# Patient Record
Sex: Female | Born: 1975 | Hispanic: Yes | Smoking: Never smoker
Health system: Southern US, Community
[De-identification: ages and names within clinical notes are randomized; demographics above are authoritative.]

## PROBLEM LIST (undated history)

## (undated) DIAGNOSIS — I10 Essential (primary) hypertension: Secondary | ICD-10-CM

## (undated) DIAGNOSIS — D649 Anemia, unspecified: Secondary | ICD-10-CM

## (undated) DIAGNOSIS — F419 Anxiety disorder, unspecified: Secondary | ICD-10-CM

## (undated) HISTORY — PX: HERNIA REPAIR: SHX51

---

## 2004-10-19 ENCOUNTER — Inpatient Hospital Stay: Payer: Self-pay | Admitting: Obstetrics and Gynecology

## 2011-10-20 ENCOUNTER — Ambulatory Visit: Payer: Self-pay | Admitting: Obstetrics and Gynecology

## 2012-03-16 ENCOUNTER — Ambulatory Visit: Payer: Self-pay | Admitting: Urology

## 2012-03-28 ENCOUNTER — Ambulatory Visit: Payer: Self-pay | Admitting: Urology

## 2014-05-07 DIAGNOSIS — N939 Abnormal uterine and vaginal bleeding, unspecified: Secondary | ICD-10-CM | POA: Insufficient documentation

## 2014-05-07 DIAGNOSIS — IMO0002 Reserved for concepts with insufficient information to code with codable children: Secondary | ICD-10-CM | POA: Insufficient documentation

## 2014-05-23 ENCOUNTER — Ambulatory Visit: Payer: Self-pay | Admitting: Obstetrics and Gynecology

## 2014-07-30 NOTE — Op Note (Signed)
PATIENT NAME:  Dorothy Maldonado, [name] MR#:  [id number] DATE OF BIRTH:  March 05, 1976  DATE OF PROCEDURE:  03/28/2012  PRINCIPAL DIAGNOSIS: Bladder pain, pressure, urinary frequency.   POSTOPERATIVE DIAGNOSIS: Bladder pain, pressure, urinary frequency.   PROCEDURE:  1. Cystoscopy. 2. Bladder hydrodistention.   SURGEON: Dr. Assunta GamblesBrian Kishaun Erekson  ANESTHESIA: Laryngeal mask airway anesthesia.   INDICATIONS: The patient is a 39 year old Hispanic female with a history of significant urinary frequency, urgency, with bladder pain and pressure. She has not responded to standard medications. There is suspicion for possible underlying interstitial cystitis. She presents for cystoscopy and hydrodistention for further evaluation.   DESCRIPTION OF PROCEDURE: After informed consent was obtained, the patient was taken to the operating room and placed in the dorsal lithotomy position under laryngeal mask airway anesthesia. The patient was then prepped and draped in the usual standard fashion. The 22-French rigid cystoscope was introduced into the urethra under direct vision with no urethral abnormalities noted. Upon entering the bladder, the mucosa was inspected in its entirety with no gross mucosal lesions noted. Bilateral ureteral orifices were well visualized with no lesions noted. The bladder was then filled. On initial instillation, the first fill was to 450 mL. The bladder was drained. Re-examination of the mucosa demonstrated some hypervascularity with no evidence of glomerulations or excoriations. A second fill was undertaken to 850 mL. Once again, the mucosa demonstrated some hypervascularity with no glomerulations or excoriations. A third fill was undertaken also to 850 mL. This also demonstrated no significant mucosal abnormalities to suggest underlying interstitial cystitis. The bladder was then drained. The cystoscope was removed. An 18-French  red rubber catheter was then inserted into the urinary bladder; 40 mL of 2%  Lidocaine was instilled into the urinary bladder through the red rubber catheter. The catheter was then removed. The patient was returned to the supine position and awakened from laryngeal mask airway anesthesia.  She was taken to the recovery room in stable condition. There were no problems or complications. The patient tolerated the procedure well.   ____________________________ Madolyn FriezeBrian S. Achilles Dunkope, MD bsc:cb D: 03/28/2012 09:31:45 ET T: 03/28/2012 20:37:10 ET JOB#: [id number]  cc: Madolyn FriezeBrian S. Achilles Dunkope, MD, <Dictator> Madolyn FriezeBRIAN S Jaun Galluzzo MD ELECTRONICALLY SIGNED 03/29/2012 22:45

## 2014-08-06 ENCOUNTER — Other Ambulatory Visit: Payer: Self-pay

## 2014-08-12 ENCOUNTER — Inpatient Hospital Stay: Admission: RE | Admit: 2014-08-12 | Payer: Self-pay | Source: Ambulatory Visit | Admitting: Obstetrics and Gynecology

## 2014-08-12 ENCOUNTER — Encounter: Admission: RE | Payer: Self-pay | Source: Ambulatory Visit

## 2014-08-12 SURGERY — HYSTERECTOMY, ABDOMINAL
Anesthesia: Choice

## 2015-05-01 ENCOUNTER — Other Ambulatory Visit: Payer: Self-pay | Admitting: Student

## 2015-05-01 DIAGNOSIS — M5412 Radiculopathy, cervical region: Secondary | ICD-10-CM

## 2015-05-13 ENCOUNTER — Ambulatory Visit: Payer: Self-pay

## 2015-05-28 ENCOUNTER — Ambulatory Visit: Admission: RE | Admit: 2015-05-28 | Payer: BLUE CROSS/BLUE SHIELD | Source: Ambulatory Visit

## 2015-07-04 ENCOUNTER — Other Ambulatory Visit: Payer: Self-pay | Admitting: Internal Medicine

## 2015-07-04 DIAGNOSIS — Z1231 Encounter for screening mammogram for malignant neoplasm of breast: Secondary | ICD-10-CM

## 2015-07-22 ENCOUNTER — Ambulatory Visit
Admission: RE | Admit: 2015-07-22 | Discharge: 2015-07-22 | Disposition: A | Discharge status: Home / Self Care | Payer: BLUE CROSS/BLUE SHIELD | Source: Ambulatory Visit | Attending: Internal Medicine | Admitting: Internal Medicine

## 2015-07-22 DIAGNOSIS — Z1231 Encounter for screening mammogram for malignant neoplasm of breast: Secondary | ICD-10-CM | POA: Insufficient documentation

## 2016-03-01 NOTE — H&P (Signed)
Patient ID: Dorothy BonnetKaren E Tegeler is a 40 y.o. female presenting with Follow-up (pelvic pain, abnormal bleeding)  on 01/26/2016  HPI: Presenting today for discussion of treatment of AUB and pelvic pain. She did have an ultrasound:  Ut wnl  Rt ov wnl Lt complex ov cyst=1.39 cm  Her pain is improved. She feels that something is moving inside  She is requesting definitive management of 16 yrs of heavy menstrual bleeding, now daily. Her ultrasound was normal with a thin endometrial stripe 8.665mm. Her last pap smear was negative 05/07/14.  She has failed other forms of medical management. She is s/p BTL for contraception.   Hx of 3 cesarean sections, 1 with low vertical skin incision, 2 with low transverse.  Endorses deep dysparunia.  EMBx: ENDOMETRIUM, BIOPSY:  NO HYPERPLASIA OR CARCINOMA. PROLIFERATIVE ENDOMETRIUM WITH BREAKDOWN  CHANGES.   Past Medical History:  has a past medical history of Vitamin D deficiency, unspecified.  Past Surgical History:  has a past surgical history that includes Cesarean section; Hernia repair (Bilateral); and Tubal ligation. Family History: family history includes Hypertension in her mother; No Known Problems in her father. Social History:  reports that she has never smoked. She has never used smokeless tobacco. She reports that she does not drink alcohol or use illicit drugs. OB/GYN History:  OB History    Gravida Para Term Preterm AB Living   3 3 3   3    SAB TAB Ectopic Multiple Live Births             Allergies: has No Known Allergies. Medications:  Current Outpatient Prescriptions:  .  buPROPion (WELLBUTRIN XL) 300 MG XL tablet, Take 1 tablet (300 mg total) by mouth once daily., Disp: 30 tablet, Rfl: 5 .  calcium carbonate-vitamin D3 (OS-CAL 500+D) 500 mg(1,250mg ) -200 unit tablet, Take 1 tablet by mouth 2 (two) times daily with meals., Disp: , Rfl:  .  cyclobenzaprine (FLEXERIL) 10 MG tablet, Take 1 tablet (10 mg total)  by mouth 3 (three) times daily as needed., Disp: 90 tablet, Rfl: 3 .  ergocalciferol, vitamin D2, 50,000 unit capsule, Take 1 capsule (50,000 Units total) by mouth once a week., Disp: 4 capsule, Rfl: 5 .  ibuprofen (ADVIL,MOTRIN) 200 MG tablet, Take 1,200 mg by mouth as needed for Pain. Reported on 07/03/2015 , Disp: , Rfl:    Review of Systems: No SOB, no palpitations or chest pain, no new lower extremity edema, no nausea or vomiting or bowel or bladder complaints. See HPI for gyn specific ROS.   Exam:     BP (!) 128/95  Pulse 83  Ht 147.3 cm (4\' 10" )  Wt 64.3 kg (141 lb 12.8 oz)  LMP 01/19/2016 (Exact Date)  BMI 29.64 kg/m2  General: Patient is well-groomed, well-nourished, appears stated age in no acute distress  Abdomen: soft , no mass, non-tender, no rebound tenderness, no hepatomegaly  Pelvic: tanner stage 5 ,              External genitalia: vulva /labia no lesions             Urethra: no prolapse             Vagina: normal physiologic d/c, laxity in vaginal walls             Cervix: no lesions, no cervical motion tenderness, good descent             Uterus: normal size shape and contour, non-tender  Adnexa: no mass,  non-tender               Rectovaginal: External wnl   Impression:   The primary encounter diagnosis was Abnormal uterine bleeding (AUB), unspecified. Diagnoses of Pelvic pain in female and Dyspareunia, female were also pertinent to this visit.    Plan:   Patient returns for followup of pelvic pain, and persistent abnormal uterine bleeding.   We discussed various options to treat her today, and she confirms she desires hysterectomy. Because of her prior abdominal surgeries, Dorothy plan for TAH/ BS and cysto with stay 3-4 days inhouse.   This is an open procedure. The patient and I discussed the technical aspects of the procedure including the potential for risks and complications.  These include but are not limited to the risk of  infection requiring post-operative antibiotics or further procedures.  We talked about the risk of injury to adjacent organs including bladder, bowel, ureter, blood vessels or nerves.  We talked about the need to convert to an open incision.  We talked about the possible need for blood transfusion.  We talked about postop complications such as thromboembolic or cardiopulmonary complications.  She Dorothy likely remain in the hospital for several days after her procedure. All of her questions were answered.  Her preoperative exam was completed and the appropriate consents were signed. She is scheduled to undergo this procedure in the near future.   Specific Peri-operative Considerations:  - Consent: obtained today - Health Maintenance: up to date - Labs: CBC, CMP preoperatively - Studies: EKG, CXR preoperatively - Bowel Preparation: None required - Abx:  Cefoxitin 2 g - VTE ppx: SCDs perioperatively - Glucose Protocol: n/a - Beta-blockade: n/a

## 2016-03-02 ENCOUNTER — Encounter
Admission: RE | Admit: 2016-03-02 | Discharge: 2016-03-02 | Disposition: A | Discharge status: Home / Self Care | Payer: BLUE CROSS/BLUE SHIELD | Source: Ambulatory Visit | Attending: Obstetrics and Gynecology | Admitting: Obstetrics and Gynecology

## 2016-03-02 DIAGNOSIS — Z01812 Encounter for preprocedural laboratory examination: Secondary | ICD-10-CM | POA: Insufficient documentation

## 2016-03-02 HISTORY — DX: Anemia, unspecified: D64.9

## 2016-03-02 HISTORY — DX: Anxiety disorder, unspecified: F41.9

## 2016-03-02 LAB — CBC
HCT: 44 % (ref 35.0–47.0)
Hemoglobin: 15.1 g/dL (ref 12.0–16.0)
MCH: 31 pg (ref 26.0–34.0)
MCHC: 34.3 g/dL (ref 32.0–36.0)
MCV: 90.3 fL (ref 80.0–100.0)
PLATELETS: 245 10*3/uL (ref 150–440)
RBC: 4.88 MIL/uL (ref 3.80–5.20)
RDW: 14.1 % (ref 11.5–14.5)
WBC: 6.7 10*3/uL (ref 3.6–11.0)

## 2016-03-02 LAB — BASIC METABOLIC PANEL
ANION GAP: 6 (ref 5–15)
BUN: 7 mg/dL (ref 6–20)
CHLORIDE: 104 mmol/L (ref 101–111)
CO2: 26 mmol/L (ref 22–32)
Calcium: 9.3 mg/dL (ref 8.9–10.3)
Creatinine, Ser: 0.82 mg/dL (ref 0.44–1.00)
GFR calc non Af Amer: >60 mL/min (ref >60)
Glucose, Bld: 86 mg/dL (ref 65–99)
POTASSIUM: 3.5 mmol/L (ref 3.5–5.1)
SODIUM: 136 mmol/L (ref 135–145)

## 2016-03-02 LAB — TYPE AND SCREEN
ABO/RH(D): O POS
ANTIBODY SCREEN: NEGATIVE

## 2016-03-02 NOTE — Patient Instructions (Signed)
[facility] Su procedimiento est programado para: Report to Day Surgery. 2ND FLOOR MEDICAL MALL ENTRANCE Presntese a: To find out your arrival time please call [phone] between 1PM - 3PM on Friday 03/12/16. Para saber su hora de llegada por favor llame al [phone] entre la 1PM - 3PM el da:  Remember: Instructions that are not followed completely may result in serious medical risk, up to and including death, or upon the discretion of your surgeon and anesthesiologist your surgery may need to be rescheduled.  Recuerde: Las instrucciones que no se siguen completamente Armed forces logistics/support/administrative officerpueden resultar en un riesgo de salud grave, incluyendo hasta la [place] o a discrecin de su cirujano y Scientific laboratory techniciananestesilogo, su ciruga se puede posponer.   __X__ 1. Do not eat food or drink liquids after midnight. No gum chewing or hard candies.  No coma alimentos ni tome lquidos despus de la medianoche.  No mastique chicle ni caramelos  duros.     __X__ 2. No alcohol for 24 hours before or after surgery.    No tome alcohol durante las 24 horas antes ni despus de la [place].   ____ 3. Bring all medications with you on the day of surgery if instructed.    Lleve todos los medicamentos con usted el da de su ciruga si se le ha indicado as.   __X__ 4. Notify your doctor if there is any change in your medical condition (cold, fever,                             infections).    Informe a su mdico si hay algn cambio en su condicin mdica (resfriado, fiebre, infecciones).   Do not wear jewelry, make-up, hairpins, clips or nail polish.  No use joyas, maquillajes, pinzas/ganchos para el cabello ni esmalte de uas.  Do not wear lotions, powders, or perfumes. .  No use lociones, polvos o perfumes.  .    Do not shave 48 hours prior to surgery. Men may shave face and neck.  No se afeite 48 horas antes de la [place].  Los hombres pueden Commercial Metals Companyafeitarse la cara y el cuello.   Do not bring valuables  to the hospital.   No lleve objetos de valor al hospital.  [facility] is not responsible for any belongings or valuables.  [place] no se hace responsable de ningn tipo de pertenencias u objetos de Licensed conveyancervalor.               Contacts, dentures or bridgework may not be worn into surgery.  Los lentes de Eurekacontacto, las dentaduras postizas o puentes no se pueden usar en la [place].  Leave your suitcase in the car. After surgery it may be brought to your room.  Deje su maleta en el auto.  Despus de la ciruga podr traerla a su habitacin.  For patients admitted to the hospital, discharge time is determined by your treatment team.  Para los pacientes que sean ingresados al hospital, el tiempo en el cual se le dar de alta es determinado por su                equipo de [place]tratamiento.   Patients discharged the day of surgery will not be allowed to drive home. A los pacientes que se les da de alta el mismo da de la ciruga no se les permitir conducir a Higher education careers advisercasa.   Please read over the following fact sheets that  you were given: Por favor lea las siguientes hojas de informacin que le dieron:   Pain Booklet   __X__ Take these medicines the morning of surgery with A SIP OF WATER:          Owens-Illinoisome estas medicinas la maana de la ciruga con UN SORBO DE AGUA:  1. BUPROPION  2.   3.   4.       5.  6.  ____ Fleet Enema (as directed)          Enema de Fleet (segn lo indicado)    __X__ Use CHG Soap as directed          Utilice el jabn de CHG segn lo indicado  ____ Use inhalers on the day of surgery          Use los inhaladores el da de la ciruga  ____ Stop metformin 2 days prior to surgery          Deje de tomar el metformin 2 das antes de la ciruga    ____ Take 1/2 of usual insulin dose the night before surgery and none on the morning of surgery           Tome la mitad de la dosis habitual de insulina la noche antes de la [place]uga y no tome nada en la maana de la             ciruga  ____  Stop Coumadin/Plavix/aspirin on           Deje de tomar el Coumadin/Plavix/aspirina el da:  __X__ Stop Anti-inflammatories on STOP ALEVE UNTIL AFTER SURGERY          Deje de tomar antiinflamatorios el da:   ____ Stop supplements until after surgery            Deje de tomar suplementos hasta despus de la ciruga  ____ Bring C-Pap to the hospital          Lleve el C-Pap al hospital

## 2016-03-14 MED ORDER — CEFAZOLIN SODIUM-DEXTROSE 2-4 GM/100ML-% IV SOLN
2.0000 g | INTRAVENOUS | Status: AC
  Administered 2016-03-15: 2 g via INTRAVENOUS

## 2016-03-15 ENCOUNTER — Inpatient Hospital Stay: Payer: BLUE CROSS/BLUE SHIELD | Admitting: Anesthesiology

## 2016-03-15 ENCOUNTER — Encounter
Admission: RE | Disposition: A | Discharge status: Home / Self Care | Payer: Self-pay | Source: Ambulatory Visit | Attending: Obstetrics and Gynecology

## 2016-03-15 ENCOUNTER — Inpatient Hospital Stay
Admission: RE | Admit: 2016-03-15 | Discharge: 2016-03-18 | DRG: 742 | Disposition: A | Discharge status: Home / Self Care | Payer: BLUE CROSS/BLUE SHIELD | Source: Ambulatory Visit | Attending: Obstetrics and Gynecology | Admitting: Obstetrics and Gynecology

## 2016-03-15 ENCOUNTER — Inpatient Hospital Stay: Payer: BLUE CROSS/BLUE SHIELD

## 2016-03-15 DIAGNOSIS — K9171 Accidental puncture and laceration of a digestive system organ or structure during a digestive system procedure: Secondary | ICD-10-CM | POA: Diagnosis not present

## 2016-03-15 DIAGNOSIS — Z9851 Tubal ligation status: Secondary | ICD-10-CM | POA: Diagnosis not present

## 2016-03-15 DIAGNOSIS — R102 Pelvic and perineal pain: Secondary | ICD-10-CM | POA: Diagnosis present

## 2016-03-15 DIAGNOSIS — N941 Unspecified dyspareunia: Secondary | ICD-10-CM | POA: Diagnosis present

## 2016-03-15 DIAGNOSIS — N939 Abnormal uterine and vaginal bleeding, unspecified: Secondary | ICD-10-CM | POA: Diagnosis present

## 2016-03-15 DIAGNOSIS — G8929 Other chronic pain: Secondary | ICD-10-CM | POA: Diagnosis present

## 2016-03-15 DIAGNOSIS — N83201 Unspecified ovarian cyst, right side: Secondary | ICD-10-CM | POA: Diagnosis present

## 2016-03-15 DIAGNOSIS — N9972 Accidental puncture and laceration of a genitourinary system organ or structure during other procedure: Secondary | ICD-10-CM

## 2016-03-15 DIAGNOSIS — S3720XA Unspecified injury of bladder, initial encounter: Secondary | ICD-10-CM

## 2016-03-15 HISTORY — PX: CYSTOSCOPY: SHX5120

## 2016-03-15 HISTORY — PX: HYSTERECTOMY ABDOMINAL WITH SALPINGECTOMY: SHX6725

## 2016-03-15 HISTORY — PX: LAPAROSCOPY: SHX197

## 2016-03-15 HISTORY — PX: UTERINE STENT PLACEMENT: SHX6655

## 2016-03-15 LAB — APTT: APTT: 33 s (ref 24–36)

## 2016-03-15 LAB — PROTIME-INR
INR: 1.17
PROTHROMBIN TIME: 15 s (ref 11.4–15.2)

## 2016-03-15 LAB — POCT PREGNANCY, URINE: Preg Test, Ur: NEGATIVE

## 2016-03-15 LAB — FIBRINOGEN: FIBRINOGEN: 186 mg/dL — AB (ref 210–475)

## 2016-03-15 LAB — ABO/RH: ABO/RH(D): O POS

## 2016-03-15 SURGERY — EXAM UNDER ANESTHESIA
Anesthesia: General | Laterality: Bilateral | Wound class: Clean Contaminated

## 2016-03-15 MED ORDER — ACETAMINOPHEN 500 MG PO TABS
1000.0000 mg | ORAL_TABLET | Freq: Four times a day (QID) | ORAL | Status: DC
  Administered 2016-03-16 – 2016-03-18 (×4): 1000 mg via ORAL
  Filled 2016-03-15 (×6): qty 2

## 2016-03-15 MED ORDER — DOCUSATE SODIUM 100 MG PO CAPS
100.0000 mg | ORAL_CAPSULE | Freq: Two times a day (BID) | ORAL | Status: DC
  Administered 2016-03-15 – 2016-03-18 (×6): 100 mg via ORAL
  Filled 2016-03-15 (×6): qty 1

## 2016-03-15 MED ORDER — PROMETHAZINE HCL 25 MG/ML IJ SOLN: 6.2500 mg | INTRAMUSCULAR | Status: DC | PRN

## 2016-03-15 MED ORDER — FENTANYL CITRATE (PF) 100 MCG/2ML IJ SOLN
INTRAMUSCULAR | Status: AC
  Filled 2016-03-15: qty 2

## 2016-03-15 MED ORDER — ROCURONIUM BROMIDE 100 MG/10ML IV SOLN
INTRAVENOUS | Status: DC | PRN
  Administered 2016-03-15: 10 mg via INTRAVENOUS
  Administered 2016-03-15: 50 mg via INTRAVENOUS
  Administered 2016-03-15: 20 mg via INTRAVENOUS
  Administered 2016-03-15: 10 mg via INTRAVENOUS

## 2016-03-15 MED ORDER — LIDOCAINE HCL (CARDIAC) 20 MG/ML IV SOLN
INTRAVENOUS | Status: DC | PRN
  Administered 2016-03-15: 40 mg via INTRAVENOUS

## 2016-03-15 MED ORDER — NALOXONE HCL 0.4 MG/ML IJ SOLN: 0.4000 mg | INTRAMUSCULAR | Status: DC | PRN

## 2016-03-15 MED ORDER — SODIUM CHLORIDE 0.9 % IJ SOLN
INTRAMUSCULAR | Status: AC
  Filled 2016-03-15: qty 50

## 2016-03-15 MED ORDER — ACETAMINOPHEN 10 MG/ML IV SOLN
INTRAVENOUS | Status: DC | PRN
  Administered 2016-03-15: 1000 mg via INTRAVENOUS

## 2016-03-15 MED ORDER — DIPHENHYDRAMINE HCL 12.5 MG/5ML PO ELIX
12.5000 mg | ORAL_SOLUTION | Freq: Four times a day (QID) | ORAL | Status: DC | PRN
  Filled 2016-03-15: qty 5

## 2016-03-15 MED ORDER — BUPIVACAINE HCL (PF) 0.5 % IJ SOLN
INTRAMUSCULAR | Status: DC | PRN
  Administered 2016-03-15: 10 mL

## 2016-03-15 MED ORDER — MENTHOL 3 MG MT LOZG
1.0000 | LOZENGE | OROMUCOSAL | Status: DC | PRN
  Filled 2016-03-15: qty 9

## 2016-03-15 MED ORDER — BUPROPION HCL ER (XL) 300 MG PO TB24
300.0000 mg | ORAL_TABLET | Freq: Every day | ORAL | Status: DC
  Administered 2016-03-16 – 2016-03-18 (×3): 300 mg via ORAL
  Filled 2016-03-15 (×3): qty 1

## 2016-03-15 MED ORDER — BUPIVACAINE LIPOSOME 1.3 % IJ SUSP
INTRAMUSCULAR | Status: AC
  Filled 2016-03-15: qty 20

## 2016-03-15 MED ORDER — CEFAZOLIN SODIUM-DEXTROSE 2-4 GM/100ML-% IV SOLN
INTRAVENOUS | Status: AC
  Administered 2016-03-15: 2 g via INTRAVENOUS
  Filled 2016-03-15: qty 100

## 2016-03-15 MED ORDER — HYDROMORPHONE 1 MG/ML IV SOLN: INTRAVENOUS | Status: DC

## 2016-03-15 MED ORDER — MIDAZOLAM HCL 2 MG/2ML IJ SOLN
INTRAMUSCULAR | Status: DC | PRN
  Administered 2016-03-15: 2 mg via INTRAVENOUS

## 2016-03-15 MED ORDER — OXYCODONE HCL 5 MG/5ML PO SOLN: 5.0000 mg | Freq: Once | ORAL | Status: DC | PRN

## 2016-03-15 MED ORDER — CEFAZOLIN IN D5W 1 GM/50ML IV SOLN
INTRAVENOUS | Status: DC | PRN
  Administered 2016-03-15: 2 g via INTRAVENOUS

## 2016-03-15 MED ORDER — OXYCODONE HCL 5 MG PO TABS: 5.0000 mg | ORAL_TABLET | Freq: Once | ORAL | Status: DC | PRN

## 2016-03-15 MED ORDER — LACTATED RINGERS IV SOLN
INTRAVENOUS | Status: DC
  Administered 2016-03-15: 09:00:00 via INTRAVENOUS

## 2016-03-15 MED ORDER — BELLADONNA ALKALOIDS-OPIUM 16.2-60 MG RE SUPP: 1.0000 | Freq: Four times a day (QID) | RECTAL | Status: DC | PRN

## 2016-03-15 MED ORDER — ONDANSETRON HCL 4 MG/2ML IJ SOLN
INTRAMUSCULAR | Status: DC | PRN
  Administered 2016-03-15: 4 mg via INTRAVENOUS

## 2016-03-15 MED ORDER — NITROFURANTOIN MONOHYD MACRO 100 MG PO CAPS: 100.0000 mg | ORAL_CAPSULE | Freq: Two times a day (BID) | ORAL | Status: DC

## 2016-03-15 MED ORDER — BUPIVACAINE LIPOSOME 1.3 % IJ SUSP
INTRAMUSCULAR | Status: DC | PRN
  Administered 2016-03-15: 100 mL

## 2016-03-15 MED ORDER — PROPOFOL 10 MG/ML IV BOLUS
INTRAVENOUS | Status: DC | PRN
  Administered 2016-03-15: 150 mg via INTRAVENOUS

## 2016-03-15 MED ORDER — PHENAZOPYRIDINE HCL 100 MG PO TABS
100.0000 mg | ORAL_TABLET | Freq: Three times a day (TID) | ORAL | Status: DC
  Administered 2016-03-15 – 2016-03-18 (×9): 100 mg via ORAL
  Filled 2016-03-15 (×9): qty 1

## 2016-03-15 MED ORDER — SODIUM CHLORIDE 0.9% FLUSH: 9.0000 mL | INTRAVENOUS | Status: DC | PRN

## 2016-03-15 MED ORDER — LACTATED RINGERS IV SOLN
INTRAVENOUS | Status: DC
  Administered 2016-03-15 – 2016-03-17 (×6): via INTRAVENOUS

## 2016-03-15 MED ORDER — MEPERIDINE HCL 25 MG/ML IJ SOLN: 6.2500 mg | INTRAMUSCULAR | Status: DC | PRN

## 2016-03-15 MED ORDER — CEFAZOLIN SODIUM-DEXTROSE 2-4 GM/100ML-% IV SOLN
2.0000 g | Freq: Four times a day (QID) | INTRAVENOUS | Status: AC
  Administered 2016-03-15 – 2016-03-16 (×4): 2 g via INTRAVENOUS
  Filled 2016-03-15 (×4): qty 100

## 2016-03-15 MED ORDER — ERGOCALCIFEROL 1.25 MG (50000 UT) PO CAPS
50000.0000 [IU] | ORAL_CAPSULE | ORAL | Status: DC
  Administered 2016-03-16: 50000 [IU] via ORAL
  Filled 2016-03-15: qty 1

## 2016-03-15 MED ORDER — BUPIVACAINE HCL (PF) 0.5 % IJ SOLN
INTRAMUSCULAR | Status: AC
  Filled 2016-03-15: qty 30

## 2016-03-15 MED ORDER — SUGAMMADEX SODIUM 200 MG/2ML IV SOLN
INTRAVENOUS | Status: DC | PRN
  Administered 2016-03-15: 126.2 mg via INTRAVENOUS

## 2016-03-15 MED ORDER — ACETAMINOPHEN 10 MG/ML IV SOLN
INTRAVENOUS | Status: AC
  Filled 2016-03-15: qty 100

## 2016-03-15 MED ORDER — ONDANSETRON HCL 4 MG/2ML IJ SOLN: 4.0000 mg | Freq: Four times a day (QID) | INTRAMUSCULAR | Status: DC | PRN

## 2016-03-15 MED ORDER — FENTANYL CITRATE (PF) 100 MCG/2ML IJ SOLN
25.0000 ug | INTRAMUSCULAR | Status: DC | PRN
  Administered 2016-03-15 (×2): 50 ug via INTRAVENOUS

## 2016-03-15 MED ORDER — DIPHENHYDRAMINE HCL 50 MG/ML IJ SOLN
12.5000 mg | Freq: Four times a day (QID) | INTRAMUSCULAR | Status: DC | PRN
  Administered 2016-03-17: 12.5 mg via INTRAVENOUS
  Filled 2016-03-15: qty 1

## 2016-03-15 MED ORDER — PHENYLEPHRINE HCL 10 MG/ML IJ SOLN
INTRAMUSCULAR | Status: DC | PRN
  Administered 2016-03-15 (×5): 100 ug via INTRAVENOUS

## 2016-03-15 MED ORDER — FERROUS SULFATE 325 (65 FE) MG PO TABS
325.0000 mg | ORAL_TABLET | Freq: Every day | ORAL | Status: DC
  Administered 2016-03-16 – 2016-03-18 (×3): 325 mg via ORAL
  Filled 2016-03-15 (×3): qty 1

## 2016-03-15 MED ORDER — CELECOXIB 200 MG PO CAPS
200.0000 mg | ORAL_CAPSULE | Freq: Two times a day (BID) | ORAL | Status: DC
  Administered 2016-03-16 – 2016-03-18 (×5): 200 mg via ORAL
  Filled 2016-03-15 (×5): qty 1

## 2016-03-15 MED ORDER — CEFAZOLIN SODIUM-DEXTROSE 2-3 GM-% IV SOLR
INTRAVENOUS | Status: DC | PRN
  Administered 2016-03-15: 2 g via INTRAVENOUS

## 2016-03-15 MED ORDER — OXYCODONE HCL 5 MG PO TABS
5.0000 mg | ORAL_TABLET | ORAL | Status: DC | PRN
  Administered 2016-03-16 – 2016-03-17 (×6): 5 mg via ORAL
  Filled 2016-03-15 (×7): qty 1

## 2016-03-15 MED ORDER — FENTANYL CITRATE (PF) 100 MCG/2ML IJ SOLN
INTRAMUSCULAR | Status: DC | PRN
  Administered 2016-03-15 (×3): 50 ug via INTRAVENOUS
  Administered 2016-03-15: 100 ug via INTRAVENOUS

## 2016-03-15 MED ORDER — METHYLENE BLUE 0.5 % INJ SOLN
INTRAVENOUS | Status: AC
Start: 2016-03-15 — End: 2016-03-15
  Filled 2016-03-15: qty 10

## 2016-03-15 MED ORDER — LACTATED RINGERS IV SOLN: INTRAVENOUS | Status: DC

## 2016-03-15 MED ORDER — EPHEDRINE SULFATE 50 MG/ML IJ SOLN
INTRAMUSCULAR | Status: DC | PRN
  Administered 2016-03-15 (×3): 5 mg via INTRAVENOUS

## 2016-03-15 MED ORDER — DEXAMETHASONE SODIUM PHOSPHATE 10 MG/ML IJ SOLN
INTRAMUSCULAR | Status: DC | PRN
  Administered 2016-03-15: 8 mg via INTRAVENOUS

## 2016-03-15 MED ORDER — METHYLENE BLUE 0.5 % INJ SOLN
INTRAVENOUS | Status: DC | PRN
  Administered 2016-03-15: 250 mL via INTRAVESICAL

## 2016-03-15 MED ORDER — GABAPENTIN 300 MG PO CAPS
900.0000 mg | ORAL_CAPSULE | Freq: Every day | ORAL | Status: DC
  Administered 2016-03-16 – 2016-03-17 (×2): 900 mg via ORAL
  Filled 2016-03-15 (×2): qty 3

## 2016-03-15 MED ORDER — HYDROMORPHONE HCL 1 MG/ML IJ SOLN
1.0000 mg | INTRAMUSCULAR | Status: DC | PRN
  Administered 2016-03-15: 1 mg via INTRAVENOUS
  Administered 2016-03-15 – 2016-03-16 (×4): 2 mg via INTRAVENOUS
  Filled 2016-03-15 (×3): qty 2
  Filled 2016-03-15: qty 1
  Filled 2016-03-15 (×2): qty 2

## 2016-03-15 SURGICAL SUPPLY — 73 items
BAG URO DRAIN 2000ML W/SPOUT (MISCELLANEOUS) ×4 IMPLANT
CANISTER SUCT 1200ML W/VALVE (MISCELLANEOUS) ×4 IMPLANT
CATH FOL 2WAY LX 16X5 (CATHETERS) ×4 IMPLANT
CATH FOL 3WAY LX 18X30 (CATHETERS) ×8 IMPLANT
CATH ROBINSON RED A/P 16FR (CATHETERS) ×4 IMPLANT
CATH TRAY 16F METER LATEX (MISCELLANEOUS) ×4 IMPLANT
CATH URETL OPEN END 4X70 (CATHETERS) ×4 IMPLANT
CHLORAPREP W/TINT 26ML (MISCELLANEOUS) ×4 IMPLANT
CORD BIP STRL DISP 12FT (MISCELLANEOUS) ×4 IMPLANT
COVER LIGHT HANDLE STERIS (MISCELLANEOUS) ×4 IMPLANT
DEFOGGER SCOPE WARMER CLEARIFY (MISCELLANEOUS) ×4 IMPLANT
DERMABOND ADVANCED (GAUZE/BANDAGES/DRESSINGS) ×2
DERMABOND ADVANCED .7 DNX12 (GAUZE/BANDAGES/DRESSINGS) ×2 IMPLANT
DRAPE LAP W/FLUID (DRAPES) ×4 IMPLANT
DRAPE UNDER BUTTOCK W/FLU (DRAPES) ×4 IMPLANT
DRSG TELFA 3X8 NADH (GAUZE/BANDAGES/DRESSINGS) ×8 IMPLANT
ELECT BLADE 6.5 EXT (BLADE) ×4 IMPLANT
ELECT CAUTERY BLADE 6.4 (BLADE) ×4 IMPLANT
ELECT REM PT RETURN 9FT ADLT (ELECTROSURGICAL) ×4
ELECTRODE REM PT RTRN 9FT ADLT (ELECTROSURGICAL) ×2 IMPLANT
GAUZE SPONGE 4X4 12PLY STRL (GAUZE/BANDAGES/DRESSINGS) ×8 IMPLANT
GLOVE BIO SURGEON STRL SZ7 (GLOVE) ×4 IMPLANT
GLOVE BIO SURGEON STRL SZ8 (GLOVE) ×4 IMPLANT
GLOVE INDICATOR 7.5 STRL GRN (GLOVE) ×4 IMPLANT
GOWN STRL REUS W/ TWL LRG LVL3 (GOWN DISPOSABLE) ×4 IMPLANT
GOWN STRL REUS W/ TWL XL LVL3 (GOWN DISPOSABLE) ×2 IMPLANT
GOWN STRL REUS W/TWL LRG LVL3 (GOWN DISPOSABLE) ×4
GOWN STRL REUS W/TWL XL LVL3 (GOWN DISPOSABLE) ×2
HEMOSTAT SURGICEL 2X14 (HEMOSTASIS) ×8 IMPLANT
IRRIGATION STRYKERFLOW (MISCELLANEOUS) ×2 IMPLANT
IRRIGATOR STRYKERFLOW (MISCELLANEOUS) ×4
IV LACTATED RINGERS 1000ML (IV SOLUTION) ×4 IMPLANT
KIT RM TURNOVER CYSTO AR (KITS) ×4 IMPLANT
LABEL OR SOLS (LABEL) ×4 IMPLANT
LIGASURE VESSEL 5MM BLUNT TIP (ELECTROSURGICAL) ×4 IMPLANT
MANIPULATOR VCARE SML CRV RETR (MISCELLANEOUS) ×4 IMPLANT
NDL HPO THNWL 1X22GA REG BVL (NEEDLE) ×2 IMPLANT
NEEDLE HYPO 22GX1.5 SAFETY (NEEDLE) ×4 IMPLANT
NEEDLE SAFETY 22GX1 (NEEDLE) ×2
NS IRRIG 500ML POUR BTL (IV SOLUTION) ×4 IMPLANT
OCCLUDER COLPOPNEUMO (BALLOONS) ×4 IMPLANT
PACK BASIN MAJOR ARMC (MISCELLANEOUS) ×4 IMPLANT
PAD PREP 24X41 OB/GYN DISP (PERSONAL CARE ITEMS) ×4 IMPLANT
PLUG CATH AND CAP STER (CATHETERS) ×4 IMPLANT
RETRACTOR WOUND ALXS 18CM MED (MISCELLANEOUS) ×2 IMPLANT
RTRCTR WOUND ALEXIS O 18CM MED (MISCELLANEOUS) ×4
SCISSORS METZENBAUM CVD 33 (INSTRUMENTS) ×4 IMPLANT
SET CYSTO W/LG BORE CLAMP LF (SET/KITS/TRAYS/PACK) ×4 IMPLANT
SLEEVE ENDOPATH XCEL 5M (ENDOMECHANICALS) ×8 IMPLANT
SOL PREP PVP 2OZ (MISCELLANEOUS) ×4
SOLUTION PREP PVP 2OZ (MISCELLANEOUS) ×2 IMPLANT
SPONGE LAP 18X18 5 PK (GAUZE/BANDAGES/DRESSINGS) ×8 IMPLANT
SPONGE XRAY 4X4 16PLY STRL (MISCELLANEOUS) ×4 IMPLANT
STAPLER SKIN PROX 35W (STAPLE) ×8 IMPLANT
SURGILUBE 2OZ TUBE FLIPTOP (MISCELLANEOUS) ×4 IMPLANT
SUT MNCRL 3-0 (SUTURE) ×4 IMPLANT
SUT MNCRL 3-0 VIOLET CT-1 (SUTURE) ×4 IMPLANT
SUT MON AB 3-0 SH 27 (SUTURE) ×4 IMPLANT
SUT MONOCRYL 3-0 (SUTURE) ×4
SUT PDS AB 1 TP1 96 (SUTURE) ×12 IMPLANT
SUT VIC AB 0 CT1 27 (SUTURE) ×8
SUT VIC AB 0 CT1 27XCR 8 STRN (SUTURE) ×8 IMPLANT
SUT VIC AB 0 CT1 36 (SUTURE) ×8 IMPLANT
SUT VIC AB 2-0 SH 27 (SUTURE) ×8
SUT VIC AB 2-0 SH 27XBRD (SUTURE) ×8 IMPLANT
SUT VICRYL PLUS ABS 0 54 (SUTURE) ×4 IMPLANT
SUTURE MNCRYL 4-0 (SUTURE) ×4 IMPLANT
SYR 10ML LL (SYRINGE) ×8 IMPLANT
SYR 50ML LL SCALE MARK (SYRINGE) ×4 IMPLANT
SYR BULB IRRIG 60ML STRL (SYRINGE) ×4 IMPLANT
SYRINGE IRR TOOMEY STRL 70CC (SYRINGE) ×4 IMPLANT
TRAY PREP VAG/GEN (MISCELLANEOUS) ×4 IMPLANT
WATER STERILE IRR 1000ML POUR (IV SOLUTION) ×4 IMPLANT

## 2016-03-15 NOTE — Interval H&P Note (Signed)
History and Physical Interval Note:  At the time of her arrival for her surgery, she received a physical exam CV: RRR Pulm: CTAB  03/15/2016 3:11 PM  Andrey CotaKaren Schnee  has presented today for surgery, with the diagnosis of AUB  Pelvic pain  The various methods of treatment have been discussed with the patient and family. After consideration of risks, benefits and other options for treatment, the patient has consented to  Procedure(s): EXAM UNDER ANESTHESIA LAPAROSCOPY DIAGNOSTIC HYSTERECTOMY ABDOMINAL WITH BILATERAL SALPINGECTOMY, RIGHT ADNEXAL BIOPSY, RIGHT OVARIAN CYSTECTOMY, RIGHT OVARIAN CYSTECTOMY (Bilateral) CYSTOSCOPY UTERINE STENT PLACEMENT AND REMOVAL as a surgical intervention .  The patient's history has been reviewed, patient examined, no change in status, stable for surgery.  I have reviewed the patient's chart and labs.  Questions were answered to the patient's satisfaction.     Christeen DouglasBEASLEY, [place]

## 2016-03-15 NOTE — Anesthesia Procedure Notes (Signed)
Procedure Name: Intubation Date/Time: 03/15/2016 10:30 AM Performed by: Henrietta HooverPOPE, Shamel Germond Pre-anesthesia Checklist: Patient identified, Emergency Drugs available, Suction available, Patient being monitored and Timeout performed Patient Re-evaluated:Patient Re-evaluated prior to inductionOxygen Delivery Method: Circle system utilized Preoxygenation: Pre-oxygenation with 100% oxygen Intubation Type: IV induction Ventilation: Mask ventilation without difficulty Laryngoscope Size: Mac and 3 Grade View: Grade II Tube type: Oral Tube size: 7.0 mm Number of attempts: 1 Airway Equipment and Method: Stylet Placement Confirmation: ETT inserted through vocal cords under direct vision,  positive ETCO2 and breath sounds checked- equal and bilateral Secured at: 21 cm Dental Injury: Teeth and Oropharynx as per pre-operative assessment

## 2016-03-15 NOTE — Interval H&P Note (Signed)
History and Physical Interval Note:  03/15/2016 9:36 AM  Dorothy CotaKaren Eckstrom  has presented today for surgery, with the diagnosis of AUB  Pelvic pain  The various methods of treatment have been discussed with the patient and family. After consideration of risks, benefits and other options for treatment, the patient has consented to  Procedure(s): HYSTERECTOMY ABDOMINAL WITH SALPINGECTOMY (Bilateral) CYSTOSCOPY (N/A) as a surgical intervention .  I will do a laparoscopic view and if we can proceed with a minimally invasive case we well. However, I think it likely we will open.  The patient's history has been reviewed, patient examined, no change in status, stable for surgery.  I have reviewed the patient's chart and labs.  Questions were answered to the patient's satisfaction.     Christeen DouglasBEASLEY, [place]

## 2016-03-15 NOTE — Op Note (Addendum)
Andrey CotaKaren Macrae PROCEDURE DATE: 03/15/2016  PREOPERATIVE DIAGNOSIS:  Abnormal uterine bleeding, pelvic pain. History of 3 prior cesarean sections 1 with a low vertical skin incision and 2 with low-transverse incisions, deep dyspareunia. POSTOPERATIVE DIAGNOSIS:  Same as above, with the addition of dense adhesions between the bladder and the uterus  SURGEON:   Christeen DouglasBethany Danniella Robben, MD. ASSISTANT: Jennell Cornerhomas Schermerhorn, M.D. ANESTHESIOLOGIST: Alver FisherAmy Penwarden, MD Anesthesiologist: Alver FisherAmy Penwarden, MD CRNA: Malva Coganatherine Beane, CRNA; Michaele OfferKasey Savage, CRNA; Henrietta HooverKimberly Pope, CRNA  OPERATION:  Exam under anesthesia, diagnostic laparoscopy, Total abdominal hysterectomy, Bilateral Salpingectomy, repair of cyststomy, cystoscopy and placement and removal left ureteral stent. Right adnexal biopsy, right ovarian cystectomy. ANESTHESIA:  General endotracheal. Exparel placed at fascial line and skin closure.  INDICATIONS: The patient is a 40 y.o. F with the aforementioned diagnoses who desires definitive surgical management. On the preoperative visit, the risks, benefits, indications, and alternatives of the procedure were reviewed with the patient.  On the day of surgery, the risks of surgery were again discussed with the patient including but not limited to: bleeding which may require transfusion or reoperation; infection which may require antibiotics; injury to bowel, bladder, ureters or other surrounding organs; need for additional procedures; thromboembolic phenomenon, incisional problems and other postoperative/anesthesia complications. Written informed consent was obtained.    OPERATIVE FINDINGS: A normally sized uterus with left pelvic sidewall adhesions. Normal tubes bilaterally. Normal left ovary. Right adnexa with a dark colored implant on the tube, and right ovarian cyst. Adhesions between the bladder and the uterus.  ESTIMATED BLOOD LOSS: 200 ml FLUIDS:  2200 ml of Lactated Ringers URINE OUTPUT:  400 ml of clear yellow  urine. SPECIMENS:  Uterus,cervix and Left fallopian tube, right adnexal biopsy and right fallopian tube sent to pathology COMPLICATIONS:  Cystotomy measuring 1 cm at the posterior left bladder, midway between the trigone and the dome. This was repaired with 4-0 Monocryl and 3-0 Vicryl in a double layer, with no tension on the suture.   DESCRIPTION OF PROCEDURE:  The patient received intravenous antibiotics and had sequential compression devices applied to her lower extremities while in the preoperative area.   She was taken to the operating room and placed under general anesthesia without difficulty.The abdomen and perineum were prepped and draped in a sterile manner, and she was placed in a dorsal supine position.  A Foley catheter was inserted into the bladder and attached to constant drainage. After an adequate timeout was performed, a 5 mm periumbilical incision was made. A 5 mm Optiview point was placed under direct visualization. Survey of the pelvic cavity noted the adhesions above. However the uterus was mobile and it appeared that the case could be accomplished laparoscopically. Therefore, 2 more 5 mm ports were placed in the left and right lower quadrant under direct visualization. Both ureters were noted to be coursing away from the operative field. The left fallopian tube and left round ligament were taken down using LigaSure electrocautery. The bladder plane was developed down the left side and into the adhesion. At this point, a cystotomy was noted, despite care taken to maintain visualization of the bladder edges. The case was stopped, and the decision was made to convert to open laparotomy for repair of the cystotomy.  A Pfannensteil skin incision was made. This incision was taken down to the fascia using electrocautery with care given to maintain good hemostasis. The fascia was incised in the midline and the fascial incision was then extended bilaterally using sharp incision without  difficulty. The fascia was  then dissected off the underlying rectus muscles using blunt and sharp dissection. The rectus muscles were split bluntly in the midline and the peritoneum entered sharply without complication. There were multiple scar tissue site between the rectus muscles and the fascia, with many perforating vessels. Care was taken to maintain hemostasis, although when closing these vessels were bleeding again and needed to be re-sewn, and sealed with further electrocautery.   This peritoneal incision was then extended superiorly and inferiorly with care given to prevent bowel or bladder injury. Attention was then turned to the pelvis. A retractor was placed into the incision, and the bowel was packed away with moist laparotomy sponges. A large ALexus O retractor was placed. The uterus at this point was noted to be mobilized and was delivered up out of the abdomen.  The round ligaments on each side were clamped, suture ligated with 0 Vicryl, and transected with electrocautery allowing entry into the broad ligament. Of note, all sutures used in this procedure are 0 Vicryl unless otherwise noted. The anterior and posterior leaves of the broad ligament were separated, and the ureters were inspected to be safely away from the area of dissection bilaterally.  Adnexae were clamped on the patient's right side, cut, and doubly suture ligated.Kelly clamps were placed on the mesosalpinx of the right fallopian tube, and the fallopian tube was excised.  The pedicle was then secured with a free tie.  A similar process was carried out on the left side, allowing for bilateral salpingectomy.     A bladder flap was then created carefully, noting the prior defect. A tagging 4-0 Monocryl suture was used at this defect site.   The bladder was then bluntly dissected off the lower uterine segment and cervix with good hemostasis noted. The uterine arteries were then skeletonized bilaterally and then clamped, cut, and  doubly suture ligated with care given to prevent ureteral injury.  The uterosacral ligaments were then clamped, cut, and ligated bilaterally.  Finally, the cardinal ligaments were clamped, cut, and ligated bilaterally.  Acutely curved clamps were placed across the vagina just under the cervix, and the specimen was amputated and sent to pathology. The vaginal cuff angles were closed with Heaney stiches with care given to incorporate the uterosacral-cardinal ligament pedicles on both sides. The middle of the vaginal cuff was closed with a series of interrupted figure-of-eight sutures with care given to incorporate the anterior pubocervical fascia and the posterior rectovaginal fascia.   The pelvis was irrigated and hemostasis was reconfirmed at all pedicles and along the pelvic sidewall.  Several electrocautery areas were needed on the right side along the pelvic sidewall.   The ureters were inspected and noted to be peristalsing bilaterally.  All laparotomy sponges and instruments were removed from the abdomen. The peritoneum was closed with a running stitch, but continued bleeding caused this to take this stitch out and continue to Hunt. The perforators on the right side at the rectus muscle, encased in scar tissue, were noted to be bleeding. These were sealed with electrocautery. The fascia was closed in a running fashion. The subcutaneous layer was reapproximated with 2-0 Monocryl. The skin was closed with staples, because of her continued oozing from various sites. Coags including fibrinogen were collected from the patient at this time. A second dose of prophylactic antibiotics were given because the time from prior administration was 4 hours.Marland Kitchen. Sponge, lap, needle, and instrument counts were correct times two. The patient was taken to the recovery area awake, extubated and  in stable condition. She was not given Toradol, but was given IV acetaminophen.

## 2016-03-15 NOTE — Anesthesia Preprocedure Evaluation (Signed)
Anesthesia Evaluation  Patient identified by MRN, date of birth, ID band Patient awake    Reviewed: Allergy & Precautions, NPO status , Patient's Chart, lab work & pertinent test results  History of Anesthesia Complications Negative for: history of anesthetic complications  Airway Mallampati: II  TM Distance: >3 FB Neck ROM: Full    Dental no notable dental hx.    Pulmonary neg pulmonary ROS, neg sleep apnea, neg COPD,    breath sounds clear to auscultation- rhonchi (-) wheezing      Cardiovascular Exercise Tolerance: Good (-) hypertension(-) CAD and (-) Past MI  Rhythm:Regular Rate:Normal - Systolic murmurs and - Diastolic murmurs    Neuro/Psych Anxiety negative neurological ROS     GI/Hepatic negative GI ROS, Neg liver ROS,   Endo/Other  negative endocrine ROSneg diabetes  Renal/GU negative Renal ROS     Musculoskeletal negative musculoskeletal ROS (+)   Abdominal (+) - obese,   Peds  Hematology  (+) anemia ,   Anesthesia Other Findings Past Medical History: No date: Anemia No date: Anxiety   Reproductive/Obstetrics                             Anesthesia Physical Anesthesia Plan  ASA: II  Anesthesia Plan: General   Post-op Pain Management:    Induction: Intravenous  Airway Management Planned: Oral ETT  Additional Equipment:   Intra-op Plan:   Post-operative Plan: Extubation in OR  Informed Consent: I have reviewed the patients History and Physical, chart, labs and discussed the procedure including the risks, benefits and alternatives for the proposed anesthesia with the patient or authorized representative who has indicated his/her understanding and acceptance.   Dental advisory given  Plan Discussed with: Anesthesiologist and CRNA  Anesthesia Plan Comments:         Anesthesia Quick Evaluation

## 2016-03-15 NOTE — Progress Notes (Signed)
Day of Surgery Procedure(s) (LRB): EXAM UNDER ANESTHESIA LAPAROSCOPY DIAGNOSTIC HYSTERECTOMY ABDOMINAL WITH BILATERAL SALPINGECTOMY, RIGHT ADNEXAL BIOPSY, RIGHT OVARIAN CYSTECTOMY, RIGHT OVARIAN CYSTECTOMY (Bilateral) CYSTOSCOPY UTERINE STENT PLACEMENT AND REMOVAL  Subjective: Patient reports bladder pain. No incisional pain.    Objective: I have reviewed patient's vital signs, intake and output, medications and labs.  General: fatigued and mild distress Resp: clear to auscultation bilaterally Cardio: regular rate and rhythm, S1, S2 normal, no murmur, click, rub or gallop GI: soft, non-tender; bowel sounds normal; no masses,  no organomegaly and incision: dressing c/d/i  Assessment: s/p Procedure(s): EXAM UNDER ANESTHESIA LAPAROSCOPY DIAGNOSTIC HYSTERECTOMY ABDOMINAL WITH BILATERAL SALPINGECTOMY, RIGHT ADNEXAL BIOPSY, RIGHT OVARIAN CYSTECTOMY, RIGHT OVARIAN CYSTECTOMY (Bilateral) CYSTOSCOPY UTERINE STENT PLACEMENT AND REMOVAL: stable  Plan: 1. Cystotomy: -  Foley cath in place x7 days. This is the only part that hurts her. Azo ordered.  - I placed a renal ultrasound order for 48hrs from surgery, to evaluate left kidney and ureter to assure ureteral patency. However, the ultrasound was performed today. Order confirmed for 03/17/16. Order replaced. - Plan for cystourogram 7 days after surgery with plan to remove foley in the office at that time. Macrobid day prior, day of and day after this test. - Strict Is/Os  2. Postop care: Routine. ADAT.  3. Pain: PCA if needed, though not currently required. PO meds with iv rescue currently. 4. OOB as tolerated, likely tomorrow. 5. PPX: Incentive spirometer. Continue SCDs until ambulating.    LOS: 0 days    Dorothy Maldonado 03/15/2016, 7:18 PM

## 2016-03-15 NOTE — Transfer of Care (Signed)
Immediate Anesthesia Transfer of Care Note  Patient: Dorothy CotaKaren Kasinger  Procedure(s) Performed: Procedure(s): HYSTERECTOMY ABDOMINAL WITH SALPINGECTOMY (Bilateral) CYSTOSCOPY (N/A)  Patient Location: PACU  Anesthesia Type:General  Level of Consciousness: awake  Airway & Oxygen Therapy: Patient Spontanous Breathing and Patient connected to face mask oxygen  Post-op Assessment: Report given to RN and Post -op Vital signs reviewed and stable  Post vital signs: Reviewed and stable  Last Vitals:  Vitals:   03/15/16 0941 03/15/16 1454  BP:  114/68  Pulse:  90  Resp:  20  Temp: 37.4 C 36.4 C    Last Pain:  Vitals:   03/15/16 0941  TempSrc: Tympanic         Complications: No apparent anesthesia complications

## 2016-03-15 NOTE — OR Nursing (Signed)
Clarified  Obtained consent with Dr Dalbert GarnetBeasley.

## 2016-03-16 ENCOUNTER — Encounter: Payer: Self-pay | Admitting: Obstetrics and Gynecology

## 2016-03-16 LAB — BASIC METABOLIC PANEL
Anion gap: 7 (ref 5–15)
BUN: 7 mg/dL (ref 6–20)
CHLORIDE: 106 mmol/L (ref 101–111)
CO2: 24 mmol/L (ref 22–32)
CREATININE: 0.86 mg/dL (ref 0.44–1.00)
Calcium: 8.3 mg/dL — ABNORMAL LOW (ref 8.9–10.3)
GFR calc non Af Amer: >60 mL/min (ref >60)
Glucose, Bld: 128 mg/dL — ABNORMAL HIGH (ref 65–99)
POTASSIUM: 4.1 mmol/L (ref 3.5–5.1)
SODIUM: 137 mmol/L (ref 135–145)

## 2016-03-16 LAB — CBC
HEMATOCRIT: 35.4 % (ref 35.0–47.0)
HEMOGLOBIN: 12 g/dL (ref 12.0–16.0)
MCH: 30.8 pg (ref 26.0–34.0)
MCHC: 34 g/dL (ref 32.0–36.0)
MCV: 90.6 fL (ref 80.0–100.0)
Platelets: 242 10*3/uL (ref 150–440)
RBC: 3.9 MIL/uL (ref 3.80–5.20)
RDW: 14.1 % (ref 11.5–14.5)
WBC: 11.2 10*3/uL — ABNORMAL HIGH (ref 3.6–11.0)

## 2016-03-16 MED ORDER — SIMETHICONE 80 MG PO CHEW
80.0000 mg | CHEWABLE_TABLET | Freq: Four times a day (QID) | ORAL | Status: DC | PRN
  Administered 2016-03-16 (×2): 80 mg via ORAL
  Filled 2016-03-16 (×2): qty 1

## 2016-03-16 NOTE — Progress Notes (Signed)
1 Day Post-Op Procedure(s) (LRB): EXAM UNDER ANESTHESIA LAPAROSCOPY DIAGNOSTIC HYSTERECTOMY ABDOMINAL WITH BILATERAL SALPINGECTOMY, RIGHT ADNEXAL BIOPSY, RIGHT OVARIAN CYSTECTOMY, RIGHT OVARIAN CYSTECTOMY (Bilateral) CYSTOSCOPY UTERINE STENT PLACEMENT AND REMOVAL  Subjective: Patient reports incisional pain.  Bladder pain  Objective: I have reviewed patient's vital signs, intake and output, medications and labs.  General: alert, cooperative and appears stated age Resp: clear to auscultation bilaterally Cardio: regular rate and rhythm, S1, S2 normal, no murmur, click, rub or gallop GI: soft, non-tender; bowel sounds normal; no masses,  no organomegaly and dressing: clean, dry and intact Extremities: extremities normal, atraumatic, no cyanosis or edema and Homans sign is negative, no sign of DVT. SCDs in place  Assessment: s/p Procedure(s): EXAM UNDER ANESTHESIA LAPAROSCOPY DIAGNOSTIC HYSTERECTOMY ABDOMINAL WITH BILATERAL SALPINGECTOMY, RIGHT ADNEXAL BIOPSY, RIGHT OVARIAN CYSTECTOMY, RIGHT OVARIAN CYSTECTOMY (Bilateral) CYSTOSCOPY UTERINE STENT PLACEMENT AND REMOVAL: stable  Plan: Advance diet Encourage ambulation - she does not feel ready, but we will at least sit on the bed Advance to PO medication with IV rescue Continue foley due to cystotomy  LOS: 1 day    Dorothy Maldonado 03/16/2016, 8:40 AM

## 2016-03-16 NOTE — Anesthesia Postprocedure Evaluation (Signed)
Anesthesia Post Note  Patient: Dorothy CotaKaren File  Procedure(s) Performed: Procedure(s) (LRB): EXAM UNDER ANESTHESIA LAPAROSCOPY DIAGNOSTIC HYSTERECTOMY ABDOMINAL WITH BILATERAL SALPINGECTOMY, RIGHT ADNEXAL BIOPSY, RIGHT OVARIAN CYSTECTOMY, RIGHT OVARIAN CYSTECTOMY (Bilateral) CYSTOSCOPY UTERINE STENT PLACEMENT AND REMOVAL  Patient location during evaluation: PACU Anesthesia Type: General Level of consciousness: awake and alert Pain management: pain level controlled Vital Signs Assessment: post-procedure vital signs reviewed and stable Respiratory status: spontaneous breathing, nonlabored ventilation and respiratory function stable Cardiovascular status: blood pressure returned to baseline and stable Postop Assessment: no signs of nausea or vomiting Anesthetic complications: no    Last Vitals:  Vitals:   03/15/16 2300 03/16/16 0300  BP: 100/60 100/61  Pulse: 80 84  Resp: 16 16  Temp: 37.1 C 36.8 C    Last Pain:  Vitals:   03/16/16 0605  TempSrc:   PainSc: Asleep                 Draya Felker

## 2016-03-17 LAB — SURGICAL PATHOLOGY

## 2016-03-17 NOTE — Progress Notes (Signed)
2 Days Post-Op Procedure(s) (LRB): EXAM UNDER ANESTHESIA LAPAROSCOPY DIAGNOSTIC HYSTERECTOMY ABDOMINAL WITH BILATERAL SALPINGECTOMY, RIGHT ADNEXAL BIOPSY, RIGHT OVARIAN CYSTECTOMY, RIGHT OVARIAN CYSTECTOMY (Bilateral) CYSTOSCOPY UTERINE STENT PLACEMENT AND REMOVAL  Subjective: Patient reports no n/v. Tolerating po. PO meds today, no iv. OOB x1 to chair Renal ultrasound today normal, with no hydro on left or right.  Objective: I have reviewed patient's vital signs, intake and output, medications and labs.  General: alert, cooperative and appears stated age Resp: clear to auscultation bilaterally Cardio: regular rate and rhythm, S1, S2 normal, no murmur, click, rub or gallop GI: soft, non-tender; bowel sounds normal; no masses,  no organomegaly and incision: clean, dry and intact Extremities: extremities normal, atraumatic, no cyanosis or edema  Assessment: s/p Procedure(s): EXAM UNDER ANESTHESIA LAPAROSCOPY DIAGNOSTIC HYSTERECTOMY ABDOMINAL WITH BILATERAL SALPINGECTOMY, RIGHT ADNEXAL BIOPSY, RIGHT OVARIAN CYSTECTOMY, RIGHT OVARIAN CYSTECTOMY (Bilateral) CYSTOSCOPY UTERINE STENT PLACEMENT AND REMOVAL: stable  Plan: Renal scan reassuring. Will repeat labs tomorrow. Advance diet Encourage ambulation  Advance to PO medication with IV rescue Continue foley due to cystotomy LOS: 1 day    LOS: 2 days    Gray Maugeri 03/17/2016, 12:54 PM

## 2016-03-18 LAB — BASIC METABOLIC PANEL
ANION GAP: 4 — AB (ref 5–15)
BUN: <5 mg/dL — ABNORMAL LOW (ref 6–20)
CHLORIDE: 106 mmol/L (ref 101–111)
CO2: 30 mmol/L (ref 22–32)
Calcium: 8.4 mg/dL — ABNORMAL LOW (ref 8.9–10.3)
Creatinine, Ser: 0.74 mg/dL (ref 0.44–1.00)
GFR calc non Af Amer: >60 mL/min (ref >60)
Glucose, Bld: 88 mg/dL (ref 65–99)
POTASSIUM: 3.3 mmol/L — AB (ref 3.5–5.1)
Sodium: 140 mmol/L (ref 135–145)

## 2016-03-18 LAB — CBC
HEMATOCRIT: 34.5 % — AB (ref 35.0–47.0)
HEMOGLOBIN: 11.8 g/dL — AB (ref 12.0–16.0)
MCH: 31.5 pg (ref 26.0–34.0)
MCHC: 34.3 g/dL (ref 32.0–36.0)
MCV: 91.8 fL (ref 80.0–100.0)
Platelets: 208 10*3/uL (ref 150–440)
RBC: 3.76 MIL/uL — ABNORMAL LOW (ref 3.80–5.20)
RDW: 14.2 % (ref 11.5–14.5)
WBC: 7.2 10*3/uL (ref 3.6–11.0)

## 2016-03-18 MED ORDER — IBUPROFEN 800 MG PO TABS: 800.0000 mg | ORAL_TABLET | Freq: Three times a day (TID) | ORAL | 0 refills | Status: AC | PRN

## 2016-03-18 MED ORDER — DOCUSATE SODIUM 100 MG PO CAPS: 100.0000 mg | ORAL_CAPSULE | Freq: Two times a day (BID) | ORAL | 0 refills | Status: AC

## 2016-03-18 MED ORDER — PHENAZOPYRIDINE HCL 100 MG PO TABS: 100.0000 mg | ORAL_TABLET | Freq: Three times a day (TID) | ORAL | 0 refills | Status: DC

## 2016-03-18 MED ORDER — SIMETHICONE 80 MG PO CHEW: 80.0000 mg | CHEWABLE_TABLET | Freq: Four times a day (QID) | ORAL | 0 refills | Status: AC | PRN

## 2016-03-18 MED ORDER — ONDANSETRON 4 MG PO TBDP: 4.0000 mg | ORAL_TABLET | Freq: Three times a day (TID) | ORAL | 0 refills | Status: AC | PRN

## 2016-03-18 MED ORDER — OXYCODONE HCL 5 MG PO TABS: 5.0000 mg | ORAL_TABLET | ORAL | 0 refills | Status: AC | PRN

## 2016-03-18 MED ORDER — DOCUSATE SODIUM 100 MG PO CAPS: 100.0000 mg | ORAL_CAPSULE | Freq: Every day | ORAL | 3 refills | Status: AC | PRN

## 2016-03-18 MED ORDER — NITROFURANTOIN MONOHYD MACRO 100 MG PO CAPS: 100.0000 mg | ORAL_CAPSULE | Freq: Two times a day (BID) | ORAL | 0 refills | Status: AC

## 2016-03-18 NOTE — Discharge Summary (Signed)
Physician Discharge Summary  Patient ID: Dorothy CotaKaren Maldonado MRN: [id number] DOB/AGE: [birth year] 40 y.o.  Admit date: 03/15/2016 Discharge date: 03/18/2016  Admission Diagnoses:  Discharge Diagnoses:  Active Problems:   Chronic pelvic pain in female   Discharged Condition: good  Hospital Course: Patient is postop day 3 from an attempted T LH, complicated by a 4 mm cystotomy and conversion to a TAH, BSO. Long acting local anesthetic was placed in the fashion and the skin, and she is doing well with pain. She will go home with a Foley catheter for a full 7 days, with a cystourogram on day 7 prior to removal of the catheter. Postoperative visit at this time as well. That appointment was made prior to her discharge. She'll take Macrobid the day before the day of the day after the procedure prophylactically. On her day of discharge, she is and relating without problems, her pain is controlled with by mouth medications, she is tolerating a regular diet, and she has moved her bowels. Her urine output is adequate. Her staples were removed from her incisional site on postop day 3.  Consults: None  Significant Diagnostic Studies:hypokalemia Treatments: IV hydration and surgery: as above  Discharge Exam: Blood pressure 129/75, pulse 83, temperature 98.4 F (36.9 C), temperature source Oral, resp. rate 16, height 4\' 10"  (1.473 m), weight 139 lb (63 kg), last menstrual period 03/05/2016, SpO2 99 %. General appearance: alert, cooperative and appears stated age Resp: clear to auscultation bilaterally Cardio: regular rate and rhythm, S1, S2 normal, no murmur, click, rub or gallop GI: soft, non-tender; bowel sounds normal; no masses,  no organomegaly and Incision: Clean dry intact, staples intact. Pelvic: external genitalia normal  Disposition:      Medication List    TAKE these medications   ALEVE 220 MG Caps Generic drug:  Naproxen Sodium Take 1 capsule by mouth 2 (two) times daily as needed.    buPROPion 300 MG 24 hr tablet Commonly known as:  WELLBUTRIN XL Take 300 mg by mouth daily.   docusate sodium 100 MG capsule Commonly known as:  COLACE Take 1 capsule (100 mg total) by mouth 2 (two) times daily.   docusate sodium 100 MG capsule Commonly known as:  COLACE Take 1 capsule (100 mg total) by mouth daily as needed for mild constipation.   ergocalciferol 50000 units capsule Commonly known as:  VITAMIN D2 Take 50,000 Units by mouth once a week.   ibuprofen 800 MG tablet Commonly known as:  ADVIL,MOTRIN Take 1 tablet (800 mg total) by mouth every 8 (eight) hours as needed for moderate pain or cramping.   Iron 325 (65 Fe) MG Tabs Take 1 tablet by mouth daily.   nitrofurantoin (macrocrystal-monohydrate) 100 MG capsule Commonly known as:  MACROBID Take 1 capsule (100 mg total) by mouth every 12 (twelve) hours. Start taking on:  03/21/2016   ondansetron 4 MG disintegrating tablet Commonly known as:  ZOFRAN ODT Take 1 tablet (4 mg total) by mouth every 8 (eight) hours as needed for nausea or vomiting.   oxyCODONE 5 MG immediate release tablet Commonly known as:  Oxy IR/ROXICODONE Take 1 tablet (5 mg total) by mouth every 3 (three) hours as needed for severe pain.   phenazopyridine 100 MG tablet Commonly known as:  PYRIDIUM Take 1 tablet (100 mg total) by mouth 3 (three) times daily with meals.   simethicone 80 MG chewable tablet Commonly known as:  MYLICON Chew 1 tablet (80 mg total) by mouth 4 (four) times daily  as needed for flatulence.       patient has been prescribed  prescription strength Motrin, but she is aware not to take it with the Aleve.  SignedChristeen Douglas: Ayomikun Starling 03/18/2016, 9:03 AM

## 2016-03-18 NOTE — Progress Notes (Signed)
Pt discharged home.  Discharge instructions, prescriptions and follow up appointment given to and reviewed with pt.  Pt verbalized understanding.  Escorted by auxillary. 

## 2016-03-22 ENCOUNTER — Ambulatory Visit
Admission: RE | Admit: 2016-03-22 | Discharge: 2016-03-22 | Disposition: A | Discharge status: Home / Self Care | Payer: BLUE CROSS/BLUE SHIELD | Source: Ambulatory Visit | Attending: Obstetrics and Gynecology | Admitting: Obstetrics and Gynecology

## 2016-03-22 ENCOUNTER — Other Ambulatory Visit: Payer: Self-pay | Admitting: Obstetrics and Gynecology

## 2016-03-22 DIAGNOSIS — N9981 Other intraoperative complications of genitourinary system: Secondary | ICD-10-CM | POA: Diagnosis present

## 2016-03-22 MED ORDER — IOTHALAMATE MEGLUMINE 17.2 % UR SOLN
250.0000 mL | Freq: Once | URETHRAL | Status: AC | PRN
  Administered 2016-03-22: 250 mL

## 2016-03-22 NOTE — Progress Notes (Signed)
Remove foley if bladder intact

## 2016-09-11 ENCOUNTER — Emergency Department
Admission: EM | Admit: 2016-09-11 | Discharge: 2016-09-11 | Disposition: A | Discharge status: Home / Self Care | Payer: BLUE CROSS/BLUE SHIELD | Attending: Emergency Medicine | Admitting: Emergency Medicine

## 2016-09-11 ENCOUNTER — Encounter: Payer: Self-pay | Admitting: Emergency Medicine

## 2016-09-11 DIAGNOSIS — N3000 Acute cystitis without hematuria: Secondary | ICD-10-CM | POA: Insufficient documentation

## 2016-09-11 LAB — URINALYSIS, COMPLETE (UACMP) WITH MICROSCOPIC
Bilirubin Urine: NEGATIVE
Glucose, UA: NEGATIVE mg/dL
Hgb urine dipstick: NEGATIVE
Ketones, ur: NEGATIVE mg/dL
Nitrite: NEGATIVE
PROTEIN: NEGATIVE mg/dL
Specific Gravity, Urine: 1.008 (ref 1.005–1.030)
pH: 7 (ref 5.0–8.0)

## 2016-09-11 MED ORDER — PHENAZOPYRIDINE HCL 200 MG PO TABS: 200.0000 mg | ORAL_TABLET | Freq: Three times a day (TID) | ORAL | 0 refills | Status: AC | PRN

## 2016-09-11 MED ORDER — PHENAZOPYRIDINE HCL 200 MG PO TABS
200.0000 mg | ORAL_TABLET | Freq: Once | ORAL | Status: AC
  Administered 2016-09-11: 200 mg via ORAL
  Filled 2016-09-11: qty 1

## 2016-09-11 MED ORDER — SULFAMETHOXAZOLE-TRIMETHOPRIM 800-160 MG PO TABS: 1.0000 | ORAL_TABLET | Freq: Two times a day (BID) | ORAL | 0 refills | Status: DC

## 2016-09-11 NOTE — ED Provider Notes (Signed)
[facility] Emergency Department Provider Note  ____________________________________________  Time seen: Approximately 11:19 AM  I have reviewed the triage vital signs and the nursing notes.   HISTORY  Chief Complaint Urinary Frequency    HPI Dorothy Maldonado is a 41 y.o. female who presents to the emergency department for evaluation of back pain, dysuria, and urinary frequency for the past 5 days. [facility] was [facility] and was sent here due to complaint of abdominal pain. She denies fever, nausea, vomiting, or diarrhea. She states her current symptoms are like her previous urinary tract infections.  Past Medical History:  Diagnosis Date  . Anemia   . Anxiety     Patient Active Problem List   Diagnosis Date Noted  . Chronic pelvic pain in female 03/15/2016    Past Surgical History:  Procedure Laterality Date  . CESAREAN SECTION    . CYSTOSCOPY  03/15/2016   Procedure: CYSTOSCOPY;  Surgeon: Christeen DouglasBethany Beasley, MD;  Location: [facility] ORS;  Service: Gynecology;;  . HERNIA REPAIR    . HYSTERECTOMY ABDOMINAL WITH SALPINGECTOMY Bilateral 03/15/2016   Procedure: HYSTERECTOMY ABDOMINAL WITH BILATERAL SALPINGECTOMY, RIGHT ADNEXAL BIOPSY, RIGHT OVARIAN CYSTECTOMY, RIGHT OVARIAN CYSTECTOMY;  Surgeon: Christeen DouglasBethany Beasley, MD;  Location: [facility] ORS;  Service: Gynecology;  Laterality: Bilateral;  . LAPAROSCOPY  03/15/2016   Procedure: LAPAROSCOPY DIAGNOSTIC;  Surgeon: Christeen DouglasBethany Beasley, MD;  Location: [facility] ORS;  Service: Gynecology;;  . UTERINE STENT PLACEMENT  03/15/2016   Procedure: UTERINE STENT PLACEMENT AND REMOVAL;  Surgeon: Christeen DouglasBethany Beasley, MD;  Location: [facility] ORS;  Service: Gynecology;;    Prior to Admission medications   Medication Sig Start Date End Date Taking? Authorizing Provider  buPROPion (WELLBUTRIN XL) 300 MG 24 hr tablet Take 300 mg by mouth daily.    [provider]  docusate sodium (COLACE) 100 MG capsule Take 1 capsule (100 mg  total) by mouth 2 (two) times daily. 03/18/16   Christeen DouglasBeasley, Bethany, MD  docusate sodium (COLACE) 100 MG capsule Take 1 capsule (100 mg total) by mouth daily as needed for mild constipation. 03/18/16   Christeen DouglasBeasley, Bethany, MD  ergocalciferol (VITAMIN D2) 50000 units capsule Take 50,000 Units by mouth once a week.    [provider]  Ferrous Sulfate (IRON) 325 (65 Fe) MG TABS Take 1 tablet by mouth daily.    [provider]  Naproxen Sodium (ALEVE) 220 MG CAPS Take 1 capsule by mouth 2 (two) times daily as needed.    [provider]  ondansetron (ZOFRAN ODT) 4 MG disintegrating tablet Take 1 tablet (4 mg total) by mouth every 8 (eight) hours as needed for nausea or vomiting. 03/18/16   Christeen DouglasBeasley, Bethany, MD  oxyCODONE (OXY IR/ROXICODONE) 5 MG immediate release tablet Take 1 tablet (5 mg total) by mouth every 3 (three) hours as needed for severe pain. 03/18/16   Christeen DouglasBeasley, Bethany, MD  phenazopyridine (PYRIDIUM) 200 MG tablet Take 1 tablet (200 mg total) by mouth 3 (three) times daily as needed for pain. 09/11/16 09/11/17  Stephani Janak, Kasandra Knudsenari B, FNP  simethicone (MYLICON) 80 MG chewable tablet Chew 1 tablet (80 mg total) by mouth 4 (four) times daily as needed for flatulence. 03/18/16   Christeen DouglasBeasley, Bethany, MD  sulfamethoxazole-trimethoprim (BACTRIM DS,SEPTRA DS) 800-160 MG tablet Take 1 tablet by mouth 2 (two) times daily. 09/11/16   Chinita Pesterriplett, Tereasa Yilmaz B, FNP    Allergies Patient has no known allergies.  History reviewed. No pertinent family history.  Social History Social History  Substance Use Topics  .  Smoking status: Never Smoker  . Smokeless tobacco: Never Used  . Alcohol use No    Review of Systems Constitutional: Negative for fever. Respiratory: Negative for shortness of breath or cough. Gastrointestinal: Positive for suprapubic abdominal pain; negative for nausea , negative for vomiting. Genitourinary: Positive for dysuria , negative for vaginal discharge. Musculoskeletal: Positive  for back pain. Skin: Negative for rash, lesion, or wound. ____________________________________________   PHYSICAL EXAM:  VITAL SIGNS: ED Triage Vitals  Enc Vitals Group     BP 09/11/16 1048 130/84     Pulse Rate 09/11/16 1048 74     Resp 09/11/16 1048 18     Temp 09/11/16 1048 98.6 F (37 C)     Temp Source 09/11/16 1048 Oral     SpO2 09/11/16 1048 99 %     Weight 09/11/16 1048 139 lb (63 kg)     Height 09/11/16 1048 4\' 10"  (1.473 m)     Head Circumference --      Peak Flow --      Pain Score 09/11/16 1047 9     Pain Loc --      Pain Edu? --      Excl. in GC? --     Constitutional: Alert and oriented. Well appearing and in no acute distress. Eyes: Conjunctivae are normal. PERRL. EOMI. Head: Atraumatic. Nose: No congestion/rhinnorhea. Mouth/Throat: Mucous membranes are moist. Respiratory: Normal respiratory effort.  No retractions. Gastrointestinal: Suprapubic tenderness on exam. McBurney's Point negative. Bowel sounds active and present in all 4 quadrants. Genitourinary: Pelvic exam: not indicated. Musculoskeletal: No extremity tenderness nor edema.  Neurologic:  Normal speech and language. No gross focal neurologic deficits are appreciated. Speech is normal. No gait instability. Skin:  Skin is warm, dry and intact. No rash noted. Psychiatric: Mood and affect are normal. Speech and behavior are normal.  ____________________________________________   LABS (all labs ordered are listed, but only abnormal results are displayed)  Labs Reviewed  URINALYSIS, COMPLETE (UACMP) WITH MICROSCOPIC - Abnormal; Notable for the following:       Result Value   Color, Urine YELLOW (*)    APPearance CLEAR (*)    Leukocytes, UA MODERATE (*)    Bacteria, UA RARE (*)    Squamous Epithelial / LPF 0-5 (*)    All other components within normal limits   ____________________________________________  RADIOLOGY  Not  indicated. ____________________________________________   PROCEDURES  Procedure(s) performed: None  ____________________________________________  41 year old female presenting to the emergency department for symptoms consistent with urinary tract infection. Although she was sent from  [facility] to rule out appendicitis, she demonstrates no symptoms that would be consistent with that diagnosis at this time. She will be started on Bactrim and given Pyridium. She was also given strict return precautions to return if the abdominal or back pain worsens or she develops other symptoms of concern.  INITIAL IMPRESSION / ASSESSMENT AND PLAN / ED COURSE  Pertinent labs & imaging results that were available during my care of the patient were reviewed by me and considered in my medical decision making (see chart for details).  ____________________________________________   FINAL CLINICAL IMPRESSION(S) / ED DIAGNOSES  Final diagnoses:  Acute cystitis without hematuria    Note:  This document was prepared using Dragon voice recognition software and may include unintentional dictation errors.    Chinita Pester, FNP 09/11/16 1208    Sharman Cheek, MD 09/18/16 2025

## 2016-09-11 NOTE — Discharge Instructions (Signed)
Please follow up with your primary care provider in about a week to recheck your urine. Return to the ER if the pain in your back or stomach gets worse. Take the medications as prescribed and until finished.

## 2016-09-11 NOTE — ED Notes (Signed)
See triage note  Lower back pain with some pain with urination  Sx's started about 5 days ago  Afebrile on arrival

## 2016-09-11 NOTE — ED Triage Notes (Signed)
Pt to ed with c/o urinary frequency and burning with urination x 5 days and lower back pain.

## 2017-02-07 ENCOUNTER — Other Ambulatory Visit: Payer: Self-pay | Admitting: Obstetrics and Gynecology

## 2017-02-07 DIAGNOSIS — Z1231 Encounter for screening mammogram for malignant neoplasm of breast: Secondary | ICD-10-CM

## 2017-02-21 ENCOUNTER — Ambulatory Visit: Payer: Self-pay

## 2017-02-22 ENCOUNTER — Ambulatory Visit
Admission: RE | Admit: 2017-02-22 | Discharge: 2017-02-22 | Disposition: A | Discharge status: Home / Self Care | Payer: 59 | Source: Ambulatory Visit | Attending: Obstetrics and Gynecology | Admitting: Obstetrics and Gynecology

## 2017-02-22 DIAGNOSIS — Z1231 Encounter for screening mammogram for malignant neoplasm of breast: Secondary | ICD-10-CM | POA: Insufficient documentation

## 2017-03-11 ENCOUNTER — Ambulatory Visit: Payer: Self-pay | Admitting: Urology

## 2017-03-11 ENCOUNTER — Encounter: Payer: Self-pay | Admitting: Urology

## 2017-04-14 DIAGNOSIS — N3020 Other chronic cystitis without hematuria: Secondary | ICD-10-CM | POA: Insufficient documentation

## 2017-04-15 DIAGNOSIS — R350 Frequency of micturition: Secondary | ICD-10-CM | POA: Insufficient documentation

## 2017-04-15 DIAGNOSIS — N809 Endometriosis, unspecified: Secondary | ICD-10-CM | POA: Insufficient documentation

## 2017-04-15 DIAGNOSIS — N9981 Other intraoperative complications of genitourinary system: Secondary | ICD-10-CM | POA: Insufficient documentation

## 2017-12-08 ENCOUNTER — Telehealth: Payer: Self-pay

## 2017-12-08 NOTE — Telephone Encounter (Signed)
Received a referral from Dr. Eston EstersHande's office for patient for a recurrent rash Used language line and attempted to speak with patient in regards to referral, we do not do allergy testing here, patient dropped call  Called Dr. Eston EstersHande's office for more information on referral - have not heard back

## 2018-02-08 DIAGNOSIS — G4700 Insomnia, unspecified: Secondary | ICD-10-CM | POA: Insufficient documentation

## 2018-02-08 DIAGNOSIS — R600 Localized edema: Secondary | ICD-10-CM | POA: Insufficient documentation

## 2018-02-08 DIAGNOSIS — I10 Essential (primary) hypertension: Secondary | ICD-10-CM | POA: Insufficient documentation

## 2018-02-10 ENCOUNTER — Other Ambulatory Visit: Payer: Self-pay | Admitting: Internal Medicine

## 2018-02-10 DIAGNOSIS — Z1231 Encounter for screening mammogram for malignant neoplasm of breast: Secondary | ICD-10-CM

## 2018-03-20 ENCOUNTER — Ambulatory Visit
Admission: RE | Admit: 2018-03-20 | Discharge: 2018-03-20 | Disposition: A | Discharge status: Home / Self Care | Payer: 59 | Source: Ambulatory Visit | Attending: Internal Medicine | Admitting: Internal Medicine

## 2018-03-20 DIAGNOSIS — Z1231 Encounter for screening mammogram for malignant neoplasm of breast: Secondary | ICD-10-CM | POA: Diagnosis present

## 2018-08-14 DIAGNOSIS — F411 Generalized anxiety disorder: Secondary | ICD-10-CM | POA: Insufficient documentation

## 2018-08-27 ENCOUNTER — Encounter: Payer: Self-pay | Admitting: Emergency Medicine

## 2018-08-27 ENCOUNTER — Other Ambulatory Visit: Payer: Self-pay

## 2018-08-27 ENCOUNTER — Emergency Department
Admission: EM | Admit: 2018-08-27 | Discharge: 2018-08-27 | Disposition: A | Discharge status: Home / Self Care | Payer: 59 | Attending: Emergency Medicine | Admitting: Emergency Medicine

## 2018-08-27 DIAGNOSIS — I10 Essential (primary) hypertension: Secondary | ICD-10-CM | POA: Insufficient documentation

## 2018-08-27 DIAGNOSIS — Z79899 Other long term (current) drug therapy: Secondary | ICD-10-CM | POA: Diagnosis not present

## 2018-08-27 DIAGNOSIS — R300 Dysuria: Secondary | ICD-10-CM | POA: Diagnosis present

## 2018-08-27 DIAGNOSIS — N3000 Acute cystitis without hematuria: Secondary | ICD-10-CM | POA: Diagnosis not present

## 2018-08-27 DIAGNOSIS — N760 Acute vaginitis: Secondary | ICD-10-CM | POA: Diagnosis not present

## 2018-08-27 DIAGNOSIS — B9689 Other specified bacterial agents as the cause of diseases classified elsewhere: Secondary | ICD-10-CM

## 2018-08-27 HISTORY — DX: Essential (primary) hypertension: I10

## 2018-08-27 LAB — URINALYSIS, COMPLETE (UACMP) WITH MICROSCOPIC
Bilirubin Urine: NEGATIVE
Glucose, UA: NEGATIVE mg/dL
Ketones, ur: NEGATIVE mg/dL
Nitrite: NEGATIVE
Protein, ur: NEGATIVE mg/dL
Specific Gravity, Urine: 1.004 — ABNORMAL LOW (ref 1.005–1.030)
WBC, UA: >50 WBC/hpf — ABNORMAL HIGH (ref 0–5)
pH: 7 (ref 5.0–8.0)

## 2018-08-27 MED ORDER — SULFAMETHOXAZOLE-TRIMETHOPRIM 800-160 MG PO TABS: 1.0000 | ORAL_TABLET | Freq: Two times a day (BID) | ORAL | 0 refills | Status: AC

## 2018-08-27 MED ORDER — PHENAZOPYRIDINE HCL 200 MG PO TABS
200.0000 mg | ORAL_TABLET | Freq: Once | ORAL | Status: AC
  Administered 2018-08-27: 200 mg via ORAL
  Filled 2018-08-27: qty 1

## 2018-08-27 MED ORDER — SULFAMETHOXAZOLE-TRIMETHOPRIM 800-160 MG PO TABS
1.0000 | ORAL_TABLET | Freq: Once | ORAL | Status: AC
  Administered 2018-08-27: 23:00:00 1 via ORAL
  Filled 2018-08-27: qty 1

## 2018-08-27 MED ORDER — METRONIDAZOLE 500 MG PO TABS
500.0000 mg | ORAL_TABLET | Freq: Once | ORAL | Status: AC
  Administered 2018-08-27: 500 mg via ORAL
  Filled 2018-08-27: qty 1

## 2018-08-27 MED ORDER — PHENAZOPYRIDINE HCL 200 MG PO TABS: 200.0000 mg | ORAL_TABLET | Freq: Three times a day (TID) | ORAL | 0 refills | Status: AC | PRN

## 2018-08-27 NOTE — ED Notes (Signed)
Pt reports urinary frequency and dysuria that started yesterday; pt appears uncomfortable at this time;

## 2018-08-27 NOTE — ED Triage Notes (Signed)
Patient with complaint of pain and frequency with urination that started yesterday and became worse today.

## 2018-08-27 NOTE — ED Provider Notes (Signed)
[facility] Emergency Department Provider Note  ____________________________________________  Time seen: Approximately 10:21 PM  I have reviewed the triage vital signs and the nursing notes.   HISTORY  Chief Complaint Dysuria    HPI Dorothy Maldonado is a 43 y.o. female who presents to the emergency department for treatment and evaluation of dysuria and frequency that started yesterday and is worse today.  She has had a UTI in the past that feels similar.  She also states that she has noticed some been, malodorous discharge after intercourse with her partner for the past couple of times.  She had a hysterectomy and has not had any new partners or concern of STD exposure.  She denies abdominal pain, fever, nausea, vomiting.  Past Medical History:  Diagnosis Date  . Anemia   . Anxiety   . Hypertension     Patient Active Problem List   Diagnosis Date Noted  . Chronic pelvic pain in female 03/15/2016    Past Surgical History:  Procedure Laterality Date  . CESAREAN SECTION    . CYSTOSCOPY  03/15/2016   Procedure: CYSTOSCOPY;  Surgeon: [name], MD;  Location: [facility] ORS;  Service: Gynecology;;  . HERNIA REPAIR    . HYSTERECTOMY ABDOMINAL WITH SALPINGECTOMY Bilateral 03/15/2016   Procedure: HYSTERECTOMY ABDOMINAL WITH BILATERAL SALPINGECTOMY, RIGHT ADNEXAL BIOPSY, RIGHT OVARIAN CYSTECTOMY, RIGHT OVARIAN CYSTECTOMY;  Surgeon: [name], MD;  Location: [facility] ORS;  Service: Gynecology;  Laterality: Bilateral;  . LAPAROSCOPY  03/15/2016   Procedure: LAPAROSCOPY DIAGNOSTIC;  Surgeon: [name], MD;  Location: [facility] OR[facility];  Service: Gynecology;;  . UTERINE STENT PLACEMENT  03/15/2016   Procedure: UTERINE STENT PLACEMENT AND REMOVAL;  Surgeon: [name], MD;  Location: [facility] OR[facility];  Service: Gynecology;;    Prior to Admission medications   Medication Sig Start Date End Date Taking? Authorizing Provider  buPROPion (WELLBUTRIN XL) 300 MG 24 hr tablet Take  300 mg by mouth daily.    [provider]  docusate sodium (COLACE) 100 MG capsule Take 1 capsule (100 mg total) by mouth 2 (two) times daily. 03/18/16   [name], MD  docusate sodium (COLACE) 100 MG capsule Take 1 capsule (100 mg total) by mouth daily as needed for mild constipation. 03/18/16   [name], MD  ergocalciferol (VITAMIN D2) 50000 units capsule Take 50,000 Units by mouth once a week.    [provider]  Ferrous Sulfate (IRON) 325 (65 Fe) MG TABS Take 1 tablet by mouth daily.    [provider]  Naproxen Sodium (ALEVE) 220 MG CAPS Take 1 capsule by mouth 2 (two) times daily as needed.    [provider]  ondansetron (ZOFRAN ODT) 4 MG disintegrating tablet Take 1 tablet (4 mg total) by mouth every 8 (eight) hours as needed for nausea or vomiting. 03/18/16   [name], MD  oxyCODONE (OXY IR/ROXICODONE) 5 MG immediate release tablet Take 1 tablet (5 mg total) by mouth every 3 (three) hours as needed for severe pain. 03/18/16   [name], MD  phenazopyridine (PYRIDIUM) 200 MG tablet Take 1 tablet (200 mg total) by mouth 3 (three) times daily as needed for pain. 08/27/18   Jakin Pavao, Kasandra Knudsen, FNP  simethicone (MYLICON) 80 MG chewable tablet Chew 1 tablet (80 mg total) by mouth 4 (four) times daily as needed for flatulence. 03/18/16   [name], MD  sulfamethoxazole-trimethoprim (BACTRIM DS) 800-160 MG tablet Take 1 tablet by mouth 2 (two) times daily. 08/27/18   Damieon Armendariz B,  FNP    Allergies Ceftriaxone and Ciprofloxacin  No family history on file.  Social History Social History   Tobacco Use  . Smoking status: Never Smoker  . Smokeless tobacco: Never Used  Substance Use Topics  . Alcohol use: No  . Drug use: No    Review of Systems Constitutional: Negative for fever. Respiratory: Negative for shortness of breath or cough. Gastrointestinal: Negative for abdominal pain; negative for nausea , negative for  vomiting. Genitourinary: Positive for dysuria , positive for vaginal discharge. Musculoskeletal: Negative for back pain. Skin: Negative for acute skin changes/rash/lesion. ____________________________________________   PHYSICAL EXAM:  VITAL SIGNS: ED Triage Vitals  Enc Vitals Group     BP 08/27/18 2033 125/71     Pulse Rate 08/27/18 2033 73     Resp 08/27/18 2033 18     Temp 08/27/18 2033 98.3 F (36.8 C)     Temp Source 08/27/18 2033 Oral     SpO2 08/27/18 2033 97 %     Weight 08/27/18 2046 121 lb 4.1 oz (55 kg)     Height 08/27/18 2046  (1.473 m)     Head Circumference --      Peak Flow --      Pain Score 08/27/18 2046 9     Pain Loc --      Pain Edu? --      Excl. in GC? --     Constitutional: Alert and oriented. Well appearing and in no acute distress. Eyes: Conjunctivae are normal. Head: Atraumatic. Nose: No congestion/rhinnorhea. Mouth/Throat: Mucous membranes are moist. Respiratory: Normal respiratory effort.  No retractions. Gastrointestinal: Bowel sounds active x 4; Abdomen is soft without rebound or guarding. Genitourinary: Pelvic exam: Not indicated Musculoskeletal: No extremity tenderness nor edema.  Neurologic:  Normal speech and language. No gross focal neurologic deficits are appreciated. Speech is normal. No gait instability. Skin:  Skin is warm, dry and intact. No rash noted on exposed skin. Psychiatric: Mood and affect are normal. Speech and behavior are normal.  ____________________________________________   LABS (all labs ordered are listed, but only abnormal results are displayed)  Labs Reviewed  URINALYSIS, COMPLETE (UACMP) WITH MICROSCOPIC - Abnormal; Notable for the following components:      Result Value   Color, Urine STRAW (*)    APPearance HAZY (*)    Specific Gravity, Urine 1.004 (*)    Hgb urine dipstick MODERATE (*)    Leukocytes,Ua LARGE (*)    WBC, UA >50 (*)    Bacteria, UA RARE (*)    All other components within normal  limits   ____________________________________________  RADIOLOGY  Not indicated ____________________________________________  Procedures  ____________________________________________  43 year old female presents to the emergency department for dysuria and vaginal discharge.  Urinalysis is consistent with acute cystitis.  She will be treated with Bactrim as she is allergic to ceftriaxone and ciprofloxacin.  Pelvic exam not indicated as she has had a hysterectomy, no new partners, no concern for STD.  It does sound like she has a bacterial infection and will be treated for vaginitis with Flagyl.  She was advised to avoid alcohol.  Patient was encouraged to call her gynecologist for symptoms that do not improve over the next few days while taking her medications.  She was instructed to return to the emergency department for symptoms of concern if unable to schedule an appointment.  INITIAL IMPRESSION / ASSESSMENT AND PLAN / ED COURSE  Pertinent labs & imaging results that were available during my care  of the patient were reviewed by me and considered in my medical decision making (see chart for details).  ____________________________________________   FINAL CLINICAL IMPRESSION(S) / ED DIAGNOSES  Final diagnoses:  Bacterial vaginosis  Acute cystitis without hematuria    Note:  This document was prepared using Dragon voice recognition software and may include unintentional dictation errors.   Chinita Pesterriplett, June Rode B, FNP 08/27/18 2327    Sharman CheekStafford, Phillip, MD 08/30/18 [phone]

## 2019-03-27 ENCOUNTER — Other Ambulatory Visit: Payer: Self-pay | Admitting: Obstetrics and Gynecology

## 2019-03-27 DIAGNOSIS — Z1231 Encounter for screening mammogram for malignant neoplasm of breast: Secondary | ICD-10-CM

## 2019-03-29 ENCOUNTER — Ambulatory Visit
Admission: RE | Admit: 2019-03-29 | Discharge: 2019-03-29 | Disposition: A | Discharge status: Home / Self Care | Payer: 59 | Source: Ambulatory Visit | Attending: Obstetrics and Gynecology | Admitting: Obstetrics and Gynecology

## 2019-03-29 DIAGNOSIS — Z1231 Encounter for screening mammogram for malignant neoplasm of breast: Secondary | ICD-10-CM

## 2019-09-03 DIAGNOSIS — Z8616 Personal history of COVID-19: Secondary | ICD-10-CM | POA: Insufficient documentation

## 2019-09-03 DIAGNOSIS — L280 Lichen simplex chronicus: Secondary | ICD-10-CM | POA: Insufficient documentation

## 2019-09-03 DIAGNOSIS — K219 Gastro-esophageal reflux disease without esophagitis: Secondary | ICD-10-CM | POA: Insufficient documentation

## 2020-02-12 ENCOUNTER — Ambulatory Visit: Payer: No Typology Code available for payment source | Admitting: Dermatology

## 2020-02-12 ENCOUNTER — Other Ambulatory Visit: Payer: Self-pay

## 2020-02-12 DIAGNOSIS — L309 Dermatitis, unspecified: Secondary | ICD-10-CM | POA: Diagnosis not present

## 2020-02-12 DIAGNOSIS — L719 Rosacea, unspecified: Secondary | ICD-10-CM | POA: Diagnosis not present

## 2020-02-12 MED ORDER — METRONIDAZOLE 0.75 % EX CREA: TOPICAL_CREAM | CUTANEOUS | 3 refills | Status: AC

## 2020-02-12 MED ORDER — TRIAMCINOLONE ACETONIDE 0.1 % EX CREA: TOPICAL_CREAM | CUTANEOUS | 1 refills | Status: AC

## 2020-02-12 NOTE — Patient Instructions (Addendum)
Roscea Rosacea La roscea es una afeccin a largo plazo (crnica) que afecta la piel de la cara, lo que incluye las [place], la nariz, la frente y Chief Technology Officer. Esta afeccin puede afectar tambin los ojos. La roscea hace que los vasos sanguneos que se encuentran cerca de la superficie de la piel se dilaten (se agranden), y eso hace que la piel se enrojezca. Cules son las causas? Se desconoce la causa de esta afeccin. Algunas cosas pueden hacer que la roscea empeore, por ejemplo:  Los baos calientes.  Realiza actividad fsica.  La luz del sol.  Las temperaturas muy clidas o fras.  Los alimentos y las bebidas muy calientes o condimentados.  El consumo de alcohol.  El estrs.  Los medicamentos para la presin arterial.  El uso de corticoesteroides tpicos en el rostro por un tiempo prolongado. Qu incrementa el riesgo? Es ms probable que tenga esta afeccin si:  Es mayor de 30 aos de edad.  Es mujer.  Tiene la piel clara (tez clara).  Tiene antecedentes familiares de la afeccin. Cules son los signos o los sntomas?   Enrojecimiento del rostro.  Protuberancias o granos rojos en la cara.  Darene Lamer roja y agrandada.  Ruborizarse fcilmente.  Lneas rojas en la piel.  Irritacin, ardor o picazn en los ojos.  Hinchazn de los prpados.  Drenaje que [employer] ojos.  Sensacin de [employer]. Cmo se trata? No hay una cura para esta afeccin, pero el tratamiento puede ayudar a [employer] sntomas. El mdico puede sugerirle que consulte a un especialista de la piel (dermatlogo). El tratamiento puede incluir:  Medicamentos que se colocan en la piel o se toman por boca (por va oral).  Tratamiento con lser para mejorar el aspecto de la piel.  [place]. Esto es poco frecuente. El mdico Psychologist, occupational cul es la mejor forma de cuidarse la piel. An despus de que su piel mejore, es probable que necesite continuar con el  tratamiento para evitar que la roscea vuelva a Research officer, trade union. Siga estas indicaciones en su casa: Cuidado de la piel Cudese la piel como se lo haya indicado el mdico. El mdico puede indicarle lo siguiente:  Lvese la piel delicadamente al [employer] veces por da.  Use un jabn suave.  Use una pantalla o un protector solar con factor de proteccin solar (FPS) de30 o ms.  Use cosmticos especiales para piel sensible.  Use una afeitadora elctrica en lugar de una cuchilla para afeitarse. Estilo de vida  Intente hacer un seguimiento respecto a qu alimentos hacen que esta afeccin empeore. Evite esos alimentos. Estos pueden incluir: ? Comidas condimentadas. ? Mariscos. ? Queso. ? Lquidos calientes. ? Frutos secos. ? Chocolate. ? Sal yodada.  No beba alcohol.  Evite las temperaturas muy fras o muy clidas.  Trate de reducir los niveles de estrs. Si necesita ayuda para lograrlo, consulte al mdico.  Cuando haga ejercicio, haga estas cosas para no acalorarse: ? Limite la exposicin del rostro al sol. ? Use un ventilador. ? Haga ejercicio por un perodo de tiempo ms breve, pero con ms frecuencia. Indicaciones generales  Tome y aplquese los medicamentos de venta libre y los recetados solamente como se lo haya indicado el mdico.  Si le recetaron un antibitico, aplqueselo o tmelo como se lo haya indicado el mdico. No deje de usar el antibitico aunque la afeccin mejore.      Si la roscea le ha CIG[employer] prpados, pngase compresas  tibias sobre ellos. Hgalo como se lo haya indicado el mdico.  Concurra a todas las visitas de seguimiento como se lo haya indicado el mdico. Esto es importante. Comunquese con un mdico si:  Sus sntomas empeoran.  Sus sntomas no mejoran despus de de [place].  Aparecen nuevos sntomas.  Tiene cambios en la forma de ver (visin) o tiene [employer] ojos, como enrojecimiento o picazn.  Se siente muy  triste (deprimido).  No desea comer en las cantidades normales (pierde el apetito).  Tiene dificultad para fijar la atencin (concentrarse). Resumen  La roscea es una afeccin a largo plazo que afecta la piel de la cara, lo que incluye las [place], la nariz, la frente y Chief Technology Officer.  Cudese la piel como se lo haya indicado el mdico.  Tome y aplquese los medicamentos solamente como se lo haya indicado el mdico.  Comunquese con un mdico si los sntomas empeoran o si tiene [employer] ojos. Esta informacin no tiene Theme park manager el consejo del mdico. Asegrese de hacerle al mdico cualquier pregunta que tenga. Document Revised: 10/06/2017 Document Reviewed: 10/06/2017 Elsevier Patient Education  2020 Elsevier Inc.   Soolantra Cream - Apply to face at night for rosacea (samples). Metronidazole 0.75% Cream Apply to face 1-2 times a day for rosacea.   Triamcinolone 0.1% Cream Apply to rash on body 1-2 times a day as needed. Do not use on face, groin, or underarms.

## 2020-02-12 NOTE — Progress Notes (Signed)
   New Patient Visit  Subjective  Dorothy Maldonado is a 44 y.o. female who presents for the following: Rash. She has a rash that first came up around 3 years ago. The rash comes up mainly on her neck, face, and arms, but has also come up on her legs. She has seen Dr Cheree Ditto in the past and was told to take Benadryl and Zyrtec. The rash is itchy when it comes up. It comes up about once a month and clears after about a week. Rash feels like an itch that's coming from the inside, with some pain afterwards. Sweating makes rash worse. She uses Dove soap in the shower and CeraVe Cream afterwards. She was prescribed a medication for eczema in the past but insurance wouldn't cover it.  No history of asthma. She has used dapsone on her face in the past. She has very sensitive skin.  Rash is not flared today. Mother has a h/o eczema.  The following portions of the chart were reviewed this encounter and updated as appropriate:      Review of Systems:  No other skin or systemic complaints except as noted in HPI or Assessment and Plan.  Objective  Well appearing patient in no apparent distress; mood and affect are within normal limits.  A focused examination was performed including face, neck, arms. Relevant physical exam findings are noted in the Assessment and Plan.  Objective  Face: Erythema on cheeks.  Objective  Chest: Small resolving pink papules on chest.  Photo showed similar small pink papules on arm   Assessment & Plan  Rosacea Face  Start metronidazole 0.75% cream Apply to face qd/bid  Soolantra Cream - Apply to face QHS, samples given.  Recommend daily broad spectrum sunscreen SPF 30+ to sun-exposed areas, reapply every 2 hours as needed.  Samples given Cetaphil spf 50, Neutrogena Zinc spf 50  Rosacea is a chronic progressive skin condition usually affecting the face of adults. It is treatable but not curable. It sometimes affects the eyes (ocular rosacea) as well. It may respond to  topical and/or systemic medication and can flare with stress, sun exposure, alcohol, exercise and some foods.   metroNIDAZOLE (METROCREAM) 0.75 % cream - Face  Dermatitis Chest  Chronic, unclear etiology, possible atopic Start TMC 0.1% Cream Apply to Aas rash qd/bid until improved, avoid face, groin, axilla. Dsp 80g 1Rf.  Recommend mild soap and moisturizing cream 1-2 times daily.  Dry Skin Care sheet given to patient in Spanish.  Topical steroids (such as triamcinolone, fluocinolone, fluocinonide, mometasone, clobetasol, halobetasol, betamethasone, hydrocortisone) can cause thinning and lightening of the skin if they are used for too long in the same area. Your physician has selected the right strength medicine for your problem and area affected on the body. Please use your medication only as directed by your physician to prevent side effects.    triamcinolone cream (KENALOG) 0.1 % - Chest  Return in about 2 months (around 04/13/2020) for Rosacea, Dermatitis. Patient will call for sooner appt if flares.Wendee Beavers, CMA, am acting as scribe for Willeen Niece, MD .  Documentation: I have reviewed the above documentation for accuracy and completeness, and I agree with the above.  Willeen Niece MD

## 2020-04-29 ENCOUNTER — Ambulatory Visit: Payer: No Typology Code available for payment source | Admitting: Dermatology

## 2020-06-03 ENCOUNTER — Other Ambulatory Visit: Payer: Self-pay | Admitting: Internal Medicine

## 2020-06-03 DIAGNOSIS — Z1231 Encounter for screening mammogram for malignant neoplasm of breast: Secondary | ICD-10-CM

## 2020-10-16 ENCOUNTER — Ambulatory Visit
Admission: RE | Admit: 2020-10-16 | Discharge: 2020-10-16 | Disposition: A | Discharge status: Home / Self Care | Payer: 59 | Source: Ambulatory Visit | Attending: Internal Medicine | Admitting: Internal Medicine

## 2020-10-16 ENCOUNTER — Other Ambulatory Visit: Payer: Self-pay

## 2020-10-16 DIAGNOSIS — Z1231 Encounter for screening mammogram for malignant neoplasm of breast: Secondary | ICD-10-CM

## 2020-12-23 ENCOUNTER — Ambulatory Visit (INDEPENDENT_AMBULATORY_CARE_PROVIDER_SITE_OTHER): Payer: 59 | Admitting: Nurse Practitioner

## 2020-12-23 ENCOUNTER — Other Ambulatory Visit: Payer: Self-pay

## 2020-12-23 ENCOUNTER — Encounter (INDEPENDENT_AMBULATORY_CARE_PROVIDER_SITE_OTHER): Payer: Self-pay | Admitting: Nurse Practitioner

## 2020-12-23 VITALS — BP 116/79 | HR 73 | Ht <= 58 in | Wt 152.0 lb

## 2020-12-23 DIAGNOSIS — R600 Localized edema: Secondary | ICD-10-CM | POA: Diagnosis not present

## 2020-12-23 DIAGNOSIS — I10 Essential (primary) hypertension: Secondary | ICD-10-CM | POA: Diagnosis not present

## 2020-12-23 DIAGNOSIS — M79604 Pain in right leg: Secondary | ICD-10-CM

## 2020-12-23 DIAGNOSIS — M79605 Pain in left leg: Secondary | ICD-10-CM

## 2020-12-23 DIAGNOSIS — E559 Vitamin D deficiency, unspecified: Secondary | ICD-10-CM | POA: Insufficient documentation

## 2020-12-28 ENCOUNTER — Encounter (INDEPENDENT_AMBULATORY_CARE_PROVIDER_SITE_OTHER): Payer: Self-pay | Admitting: Nurse Practitioner

## 2020-12-28 NOTE — Progress Notes (Signed)
Subjective:    Patient ID: Dorothy Maldonado, female    DOB: [birth year], 45 y.o.   MRN: [id number] Chief Complaint  Patient presents with   New Patient (Initial Visit)    NP consult Calf swelling referred by hande     Dorothy Maldonado is a 45 year old female that presents today after having pain and swelling in her legs from approximately last 2 months.  They have been feeling heavy the the end of the day.  The patient notes that her ankles get swollen as the day progresses.  She works 10-hour days standing on her feet.  Her legs become red and itchy and she has some sharp pains through them at times.  She has previously worn compression and notes that it does help some.  She also notes that she has pain with walking her calves and ankles.  This pain is not dependent on distance it is constant for her nightly in bed.  She has tried over-the-counter pain medication but is not helped at all.  The patient also thought that her shoes may be the cause and after changing her shoes she still continues to have pain.  She denies any wounds or ulcerations.   Review of Systems  Cardiovascular:  Positive for leg swelling.  Musculoskeletal:  Positive for arthralgias and myalgias.  Skin:  Positive for color change.       Pruritus  All other systems reviewed and are negative.     Objective:   Physical Exam Vitals reviewed.  HENT:     Head: Normocephalic.  Cardiovascular:     Rate and Rhythm: Normal rate.     Pulses:          Dorsalis pedis pulses are 1+ on the right side and 1+ on the left side.  Pulmonary:     Effort: Pulmonary effort is normal.  Neurological:     Mental Status: She is alert and oriented to person, place, and time.  Psychiatric:        Mood and Affect: Mood normal.        Behavior: Behavior normal.        Thought Content: Thought content normal.        Judgment: Judgment normal.    BP 116/79   Pulse 73   Ht 4\' 10"  (1.473 m)   Wt 152 lb (68.9 kg)   LMP 03/05/2016   BMI 31.77  kg/m   Past Medical History:  Diagnosis Date   Anemia    Anxiety    Hypertension     Social History   Socioeconomic History   Marital status: Divorced    Spouse name: Not on file   Number of children: Not on file   Years of education: Not on file   Highest education level: Not on file  Occupational History   Not on file  Tobacco Use   Smoking status: Never   Smokeless tobacco: Never  Substance and Sexual Activity   Alcohol use: No   Drug use: No   Sexual activity: Not on file  Other Topics Concern   Not on file  Social History Narrative   Not on file   Social Determinants of Health   Financial Resource Strain: Not on file  Food Insecurity: Not on file  Transportation Needs: Not on file  Physical Activity: Not on file  Stress: Not on file  Social Connections: Not on file  Intimate Partner Violence: Not on file    Past Surgical History:  Procedure  Laterality Date   CESAREAN SECTION     CYSTOSCOPY  03/15/2016   Procedure: CYSTOSCOPY;  Surgeon: [name], MD;  Location: [facility];  Service: Gynecology;;   HERNIA REPAIR     HYSTERECTOMY ABDOMINAL WITH SALPINGECTOMY Bilateral 03/15/2016   Procedure: HYSTERECTOMY ABDOMINAL WITH BILATERAL SALPINGECTOMY, RIGHT ADNEXAL BIOPSY, RIGHT OVARIAN CYSTECTOMY, RIGHT OVARIAN CYSTECTOMY;  Surgeon: [name], MD;  Location: [facility];  Service: Gynecology;  Laterality: Bilateral;   LAPAROSCOPY  03/15/2016   Procedure: LAPAROSCOPY DIAGNOSTIC;  Surgeon: [name], MD;  Location: [facility];  Service: Gynecology;;   UTERINE STENT PLACEMENT  03/15/2016   Procedure: UTERINE STENT PLACEMENT AND REMOVAL;  Surgeon: [name], MD;  Location: [facility];  Service: Gynecology;;    Family History  Problem Relation Age of Onset   Breast cancer Neg Hx     Allergies  Allergen Reactions   Ceftriaxone    Ciprofloxacin    Ondansetron Rash    CBC Latest Ref Rng & Units 03/18/2016 03/16/2016 03/02/2016  WBC 3.6 - 11.0 K/uL  7.2 11.2(H) 6.7  Hemoglobin 12.0 - 16.0 g/dL 11.8(L) 12.0 15.1  Hematocrit 35.0 - 47.0 % 34.5(L) 35.4 44.0  Platelets 150 - 440 K/uL 208 242 245      CMP     Component Value Date/Time   NA 140 03/18/2016 0531   K 3.3 (L) 03/18/2016 0531   CL 106 03/18/2016 0531   CO2 30 03/18/2016 0531   GLUCOSE 88 03/18/2016 0531   BUN <5 (L) 03/18/2016 0531   CREATININE 0.74 03/18/2016 0531   CALCIUM 8.4 (L) 03/18/2016 0531   GFRNONAA >60 03/18/2016 0531   GFRAA >60 03/18/2016 0531     No results found.     Assessment & Plan:   1. Bilateral leg edema I have had a long discussion with the patient regarding swelling and why it  causes symptoms.  Patient will begin wearing graduated compression stockings class 1 (20-30 mmHg) on a daily basis a prescription was given. The patient will  beginning wearing the stockings first thing in the morning and removing them in the evening. The patient is instructed specifically not to sleep in the stockings.   In addition, behavioral modification will be initiated.  This will include frequent elevation, use of over the counter pain medications and exercise such as walking.  I have reviewed systemic causes for chronic edema such as liver, kidney and cardiac etiologies.  The patient denies problems with these organ systems.    Consideration for a lymph pump will also be made based upon the effectiveness of conservative therapy.  This would help to improve the edema control and prevent sequela such as ulcers and infections   Patient should undergo duplex ultrasound of the venous system to ensure that DVT or reflux is not present.  The patient will follow-up with me after the ultrasound.   - VAS [place] LOWER EXTREMITY VENOUS REFLUX; Future  2. Pain in both lower extremities  Recommend:  The patient has atypical pain symptoms for pure atherosclerotic disease. 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  Noninvasive studies including ABI's and venous ultrasound of the legs will be obtained and the patient will follow up with me to review these studies.  I suspect the patient is c/o pseudoclaudication.  Patient should have an evaluation of his LS spine which I defer to the primary service.  The patient should continue walking and begin a more formal exercise program. The patient should continue his antiplatelet therapy and aggressive treatment of the lipid abnormalities.  The patient should begin wearing graduated compression socks 15-20 mmHg strength to control edema.   - VAS [place] ABI WITH/WO TBI; Future  3. Benign essential hypertension Continue antihypertensive medications as already ordered, these medications have been reviewed and there are no changes at this time.    Current Outpatient Medications on File Prior to Visit  Medication Sig Dispense Refill   buPROPion (WELLBUTRIN XL) 300 MG 24 hr tablet Take 300 mg by mouth daily.     chlorthalidone (HYGROTON) 25 MG tablet Take 25 mg by mouth daily.     docusate sodium (COLACE) 100 MG capsule Take 1 capsule (100 mg total) by mouth 2 (two) times daily. 10 capsule 0   docusate sodium (COLACE) 100 MG capsule Take 1 capsule (100 mg total) by mouth daily as needed for mild constipation. 60 capsule 3   ergocalciferol (VITAMIN D2) 50000 units capsule Take 50,000 Units by mouth once a week.     estradiol (ESTRACE) 0.1 MG/GM vaginal cream APPLY 0.5 MG DAILY FOR TWO WEEKS, FOLLOWED BY WEEKLY     Ferrous Sulfate (IRON) 325 (65 Fe) MG TABS Take 1 tablet by mouth daily.     metroNIDAZOLE (METROCREAM) 0.75 % cream Apply to face 1-2 times a day for rosacea. 45 g 3   metroNIDAZOLE (METROGEL) 1 % gel Apply topically.     Multiple Vitamin (MULTI-VITAMIN) tablet Take by mouth.     Naproxen Sodium 220 MG CAPS Take 1 capsule by mouth 2 (two) times daily as needed.     ondansetron (ZOFRAN ODT) 4  MG disintegrating tablet Take 1 tablet (4 mg total) by mouth every 8 (eight) hours as needed for nausea or vomiting. 20 tablet 0   oxyCODONE (OXY IR/ROXICODONE) 5 MG immediate release tablet Take 1 tablet (5 mg total) by mouth every 3 (three) hours as needed for severe pain. 30 tablet 0   phenazopyridine (PYRIDIUM) 200 MG tablet Take 1 tablet (200 mg total) by mouth 3 (three) times daily as needed for pain. 9 tablet 0   simethicone (MYLICON) 80 MG chewable tablet Chew 1 tablet (80 mg total) by mouth 4 (four) times daily as needed for flatulence. 30 tablet 0   sulfamethoxazole-trimethoprim (BACTRIM DS) 800-160 MG tablet Take 1 tablet by mouth 2 (two) times daily. 6 tablet 0   triamcinolone cream (KENALOG) 0.1 % Apply to affected areas rash on body 1-2 times a day as needed. Avoid face, groin, underarms. 80 g 1   No current facility-administered medications on file prior to visit.    There are no Patient Instructions on file for this visit. No follow-ups on file.   Georgiana Spinner, NP

## 2020-12-30 ENCOUNTER — Ambulatory Visit (INDEPENDENT_AMBULATORY_CARE_PROVIDER_SITE_OTHER): Payer: 59

## 2020-12-30 ENCOUNTER — Ambulatory Visit (INDEPENDENT_AMBULATORY_CARE_PROVIDER_SITE_OTHER): Payer: 59 | Admitting: Nurse Practitioner

## 2020-12-30 ENCOUNTER — Other Ambulatory Visit: Payer: Self-pay

## 2020-12-30 VITALS — BP 114/74 | HR 70 | Ht <= 58 in | Wt 150.0 lb

## 2020-12-30 DIAGNOSIS — I10 Essential (primary) hypertension: Secondary | ICD-10-CM

## 2020-12-30 DIAGNOSIS — R600 Localized edema: Secondary | ICD-10-CM

## 2020-12-30 DIAGNOSIS — M79604 Pain in right leg: Secondary | ICD-10-CM

## 2020-12-30 DIAGNOSIS — M79605 Pain in left leg: Secondary | ICD-10-CM | POA: Diagnosis not present

## 2021-01-05 ENCOUNTER — Encounter (INDEPENDENT_AMBULATORY_CARE_PROVIDER_SITE_OTHER): Payer: Self-pay | Admitting: Nurse Practitioner

## 2021-01-05 NOTE — Progress Notes (Signed)
Subjective:    Patient ID: Dorothy Maldonado, female    DOB: [birth year], 45 y.o.   MRN: [id number] No chief complaint on file.   Dorothy Maldonado is a 45 year old female that presents today after having pain and swelling in her legs from approximately last 2 months.  They have been feeling heavy the the end of the day.  The patient notes that her ankles get swollen as the day progresses.  She works 10-hour days standing on her feet.  Her legs become red and itchy and she has some sharp pains through them at times.  She has previously worn compression and notes that it does help some.  She also notes that she has pain with walking her calves and ankles.  This pain is not dependent on distance it is constant for her nightly in bed.  She has tried over-the-counter pain medication but is not helped at all.  The patient also thought that her shoes may be the cause and after changing her shoes she still continues to have pain.  She denies any wounds or ulcerations.  She has been having pain consistent with what is described sciatica  Today noninvasive study showed no evidence of DVT or superficial thrombophlebitis bilaterally.  No evidence of deep venous insufficiency or superficial venous reflux bilaterally.  Today the patient has an ABI of 1.20 on the right and 1.10 on the left.  Patient has triphasic tibial artery waveforms bilaterally with good toe waveforms bilaterally.   Review of Systems  Cardiovascular:  Positive for leg swelling.  Musculoskeletal:  Positive for myalgias.  All other systems reviewed and are negative.     Objective:   Physical Exam Vitals reviewed.  HENT:     Head: Normocephalic.  Cardiovascular:     Rate and Rhythm: Normal rate.     Pulses: Normal pulses.  Pulmonary:     Effort: Pulmonary effort is normal.  Musculoskeletal:     Right lower leg: No edema.     Left lower leg: No edema.  Skin:    General: Skin is warm and dry.  Neurological:     Mental Status: She is  alert and oriented to person, place, and time.  Psychiatric:        Mood and Affect: Mood normal.        Behavior: Behavior normal.        Thought Content: Thought content normal.        Judgment: Judgment normal.    BP 114/74   Pulse 70   Ht 4\' 10"  (1.473 m)   Wt 150 lb (68 kg)   LMP 03/05/2016   BMI 31.35 kg/m   Past Medical History:  Diagnosis Date   Anemia    Anxiety    Hypertension     Social History   Socioeconomic History   Marital status: Divorced    Spouse name: Not on file   Number of children: Not on file   Years of education: Not on file   Highest education level: Not on file  Occupational History   Not on file  Tobacco Use   Smoking status: Never   Smokeless tobacco: Never  Substance and Sexual Activity   Alcohol use: No   Drug use: No   Sexual activity: Not on file  Other Topics Concern   Not on file  Social History Narrative   Not on file   Social Determinants of Health   Financial Resource Strain: Not on file  Food Insecurity:  Not on file  Transportation Needs: Not on file  Physical Activity: Not on file  Stress: Not on file  Social Connections: Not on file  Intimate Partner Violence: Not on file    Past Surgical History:  Procedure Laterality Date   CESAREAN SECTION     CYSTOSCOPY  03/15/2016   Procedure: CYSTOSCOPY;  Surgeon: [name], MD;  Location: [facility];  Service: Gynecology;;   HERNIA REPAIR     HYSTERECTOMY ABDOMINAL WITH SALPINGECTOMY Bilateral 03/15/2016   Procedure: HYSTERECTOMY ABDOMINAL WITH BILATERAL SALPINGECTOMY, RIGHT ADNEXAL BIOPSY, RIGHT OVARIAN CYSTECTOMY, RIGHT OVARIAN CYSTECTOMY;  Surgeon: [name], MD;  Location: [facility];  Service: Gynecology;  Laterality: Bilateral;   LAPAROSCOPY  03/15/2016   Procedure: LAPAROSCOPY DIAGNOSTIC;  Surgeon: [name], MD;  Location: [facility];  Service: Gynecology;;   UTERINE STENT PLACEMENT  03/15/2016   Procedure: UTERINE STENT PLACEMENT AND REMOVAL;  Surgeon:  [name], MD;  Location: [facility];  Service: Gynecology;;    Family History  Problem Relation Age of Onset   Breast cancer Neg Hx     Allergies  Allergen Reactions   Ceftriaxone    Ciprofloxacin    Ondansetron Rash    CBC Latest Ref Rng & Units 03/18/2016 03/16/2016 03/02/2016  WBC 3.6 - 11.0 K/uL 7.2 11.2(H) 6.7  Hemoglobin 12.0 - 16.0 g/dL 11.8(L) 12.0 15.1  Hematocrit 35.0 - 47.0 % 34.5(L) 35.4 44.0  Platelets 150 - 440 K/uL 208 242 245      CMP     Component Value Date/Time   NA 140 03/18/2016 0531   K 3.3 (L) 03/18/2016 0531   CL 106 03/18/2016 0531   CO2 30 03/18/2016 0531   GLUCOSE 88 03/18/2016 0531   BUN <5 (L) 03/18/2016 0531   CREATININE 0.74 03/18/2016 0531   CALCIUM 8.4 (L) 03/18/2016 0531   GFRNONAA >60 03/18/2016 0531   GFRAA >60 03/18/2016 0531     VAS [place] ABI WITH/WO TBI  Result Date: 12/30/2020  LOWER EXTREMITY DOPPLER STUDY Patient Name:  Dorothy Maldonado  Date of Exam:   12/30/2020 Medical Rec #: [id number]      Accession #:    [id number] Date of Birth: 1975-06-28      Patient Gender: F Patient Age:   59 years Exam Location:  [facility] Procedure:      VAS [place] ABI WITH/WO TBI Referring Phys: Dorothy Maldonado --------------------------------------------------------------------------------  Indications: Rest pain.  Performing Technologist: Debbe Bales RVS  Examination Guidelines: A complete evaluation includes at minimum, Doppler waveform signals and systolic blood pressure reading at the level of bilateral brachial, anterior tibial, and posterior tibial arteries, when vessel segments are accessible. Bilateral testing is considered an integral part of a complete examination. Photoelectric Plethysmograph (PPG) waveforms and toe systolic pressure readings are included as required and additional duplex testing as needed. Limited examinations for reoccurring indications may be performed as noted.  ABI Findings:  +---------+------------------+-----+---------+--------+ Right    Rt Pressure (mmHg)IndexWaveform Comment  +---------+------------------+-----+---------+--------+ Brachial 122                                      +---------+------------------+-----+---------+--------+ ATA      149                    triphasic1.20     +---------+------------------+-----+---------+--------+ PTA      145  1.17 triphasic         +---------+------------------+-----+---------+--------+ Great Toe177               1.43 Normal            +---------+------------------+-----+---------+--------+ +---------+------------------+-----+---------+-------+ Left     Lt Pressure (mmHg)IndexWaveform Comment +---------+------------------+-----+---------+-------+ Brachial 124                                     +---------+------------------+-----+---------+-------+ ATA      137                    triphasic1.10    +---------+------------------+-----+---------+-------+ PTA      137               1.10 triphasic        +---------+------------------+-----+---------+-------+ Great Toe207               1.67 Normal           +---------+------------------+-----+---------+-------+ +-------+-----------+-----------+------------+------------+ ABI/TBIToday's ABIToday's TBIPrevious ABIPrevious TBI +-------+-----------+-----------+------------+------------+ Right  1.20       1.43                                +-------+-----------+-----------+------------+------------+ Left   1.10       167                                 +-------+-----------+-----------+------------+------------+  Summary: Right: Resting right ankle-brachial index is within normal range. No evidence of significant right lower extremity arterial disease. The right toe-brachial index is normal. Left: Resting left ankle-brachial index is within normal range. No evidence of significant left lower extremity arterial  disease. The left toe-brachial index is normal.  *See table(s) above for measurements and observations.  Electronically signed by Festus Barren MD on 12/30/2020 at 4:37:38 PM.    Final        Assessment & Plan:   1. Pain in both lower extremities Recommend:  I do not find evidence of Vascular pathology that would explain the patient's symptoms  The patient has atypical pain symptoms for vascular disease  I do not find evidence of Vascular pathology that would explain the patient's symptoms and I suspect the patient is c/o pseudoclaudication.  Patient should have an evaluation of his LS spine which I defer to the primary service.  Noninvasive studies including venous ultrasound of the legs do not identify vascular problems  The patient should continue walking and begin a more formal exercise program. The patient should continue his antiplatelet therapy and aggressive treatment of the lipid abnormalities. The patient should begin wearing graduated compression socks 15-20 mmHg strength to control her mild edema.  Patient will follow-up with me on a PRN basis  Further work-up of her lower extremity pain is deferred to the primary service      2. Bilateral leg edema No surgery or intervention at this point in time.  I have reviewed my discussion with the patient regarding venous insufficiency and why it causes symptoms. I have discussed with the patient the chronic skin changes that accompany venous insufficiency and the long term sequela such as ulceration. Patient will contnue wearing graduated compression stockings on a Maldonado basis, as this has provided excellent control of his edema. The patient will put the stockings on first  thing in the morning and removing them in the evening. The patient is reminded not to sleep in the stockings.  In addition, behavioral modification including elevation during the day will be initiated. Exercise is strongly encouraged.    3. Benign essential  hypertension Continue antihypertensive medications as already ordered, these medications have been reviewed and there are no changes at this time.    Current Outpatient Medications on File Prior to Visit  Medication Sig Dispense Refill   buPROPion (WELLBUTRIN XL) 300 MG 24 hr tablet Take 300 mg by mouth Maldonado.     chlorthalidone (HYGROTON) 25 MG tablet Take 25 mg by mouth Maldonado.     docusate sodium (COLACE) 100 MG capsule Take 1 capsule (100 mg total) by mouth 2 (two) times Maldonado. 10 capsule 0   docusate sodium (COLACE) 100 MG capsule Take 1 capsule (100 mg total) by mouth Maldonado as needed for mild constipation. 60 capsule 3   ergocalciferol (VITAMIN D2) 50000 units capsule Take 50,000 Units by mouth once a week.     estradiol (ESTRACE) 0.1 MG/GM vaginal cream APPLY 0.5 MG Maldonado FOR TWO WEEKS, FOLLOWED BY WEEKLY     Ferrous Sulfate (IRON) 325 (65 Fe) MG TABS Take 1 tablet by mouth Maldonado.     metroNIDAZOLE (METROCREAM) 0.75 % cream Apply to face 1-2 times a day for rosacea. 45 g 3   metroNIDAZOLE (METROGEL) 1 % gel Apply topically.     Multiple Vitamin (MULTI-VITAMIN) tablet Take by mouth.     Naproxen Sodium 220 MG CAPS Take 1 capsule by mouth 2 (two) times Maldonado as needed.     ondansetron (ZOFRAN ODT) 4 MG disintegrating tablet Take 1 tablet (4 mg total) by mouth every 8 (eight) hours as needed for nausea or vomiting. 20 tablet 0   oxyCODONE (OXY IR/ROXICODONE) 5 MG immediate release tablet Take 1 tablet (5 mg total) by mouth every 3 (three) hours as needed for severe pain. 30 tablet 0   phenazopyridine (PYRIDIUM) 200 MG tablet Take 1 tablet (200 mg total) by mouth 3 (three) times Maldonado as needed for pain. 9 tablet 0   simethicone (MYLICON) 80 MG chewable tablet Chew 1 tablet (80 mg total) by mouth 4 (four) times Maldonado as needed for flatulence. 30 tablet 0   sulfamethoxazole-trimethoprim (BACTRIM DS) 800-160 MG tablet Take 1 tablet by mouth 2 (two) times Maldonado. 6 tablet 0   triamcinolone cream  (KENALOG) 0.1 % Apply to affected areas rash on body 1-2 times a day as needed. Avoid face, groin, underarms. 80 g 1   No current facility-administered medications on file prior to visit.    There are no Patient Instructions on file for this visit. No follow-ups on file.   Georgiana Spinner, NP

## 2021-11-16 ENCOUNTER — Other Ambulatory Visit: Payer: Self-pay | Admitting: Internal Medicine

## 2021-11-16 DIAGNOSIS — Z1231 Encounter for screening mammogram for malignant neoplasm of breast: Secondary | ICD-10-CM

## 2022-01-27 ENCOUNTER — Ambulatory Visit
Admission: RE | Admit: 2022-01-27 | Discharge: 2022-01-27 | Disposition: A | Discharge status: Home / Self Care | Payer: 59 | Source: Ambulatory Visit | Attending: Internal Medicine | Admitting: Internal Medicine

## 2022-01-27 DIAGNOSIS — Z1231 Encounter for screening mammogram for malignant neoplasm of breast: Secondary | ICD-10-CM | POA: Diagnosis present

## 2022-10-19 ENCOUNTER — Other Ambulatory Visit: Payer: Self-pay | Admitting: Obstetrics and Gynecology

## 2022-10-19 DIAGNOSIS — Z1231 Encounter for screening mammogram for malignant neoplasm of breast: Secondary | ICD-10-CM

## 2022-12-02 IMAGING — MG MM DIGITAL SCREENING BILAT W/ TOMO AND CAD
8 series · 8 of 24 positions shown · non-contrast
Comparison: Previous exam(s).

CLINICAL DATA: Screening.

EXAM:
DIGITAL SCREENING BILATERAL MAMMOGRAM WITH TOMOSYNTHESIS AND CAD
TECHNIQUE: Bilateral screening digital craniocaudal and mediolateral oblique
mammograms were obtained. Bilateral screening digital breast
tomosynthesis was performed. The images were evaluated with
computer-aided detection.

[R CC synth-2D]
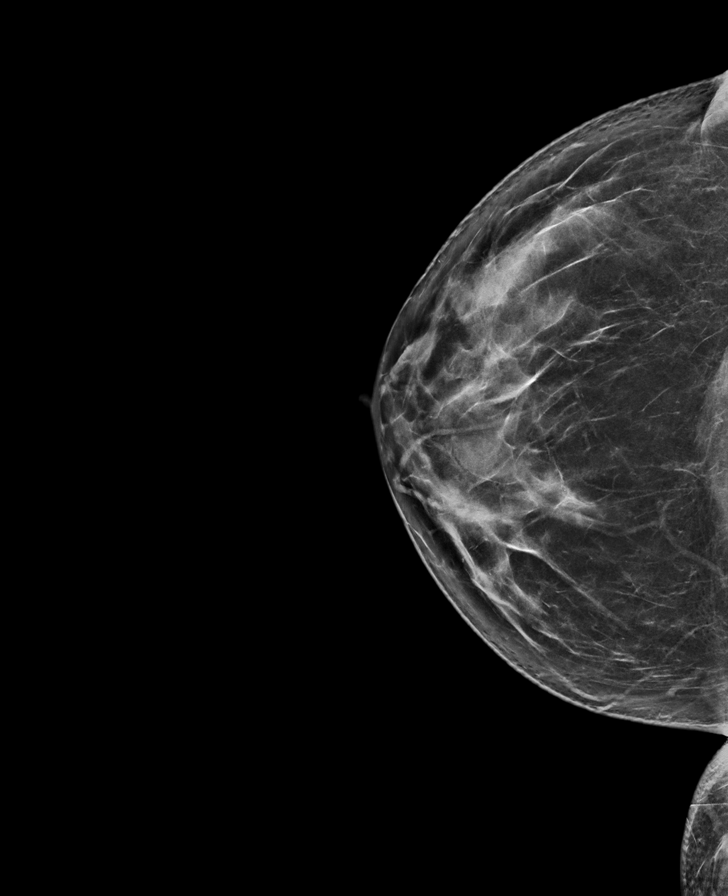

[L CC synth-2D]
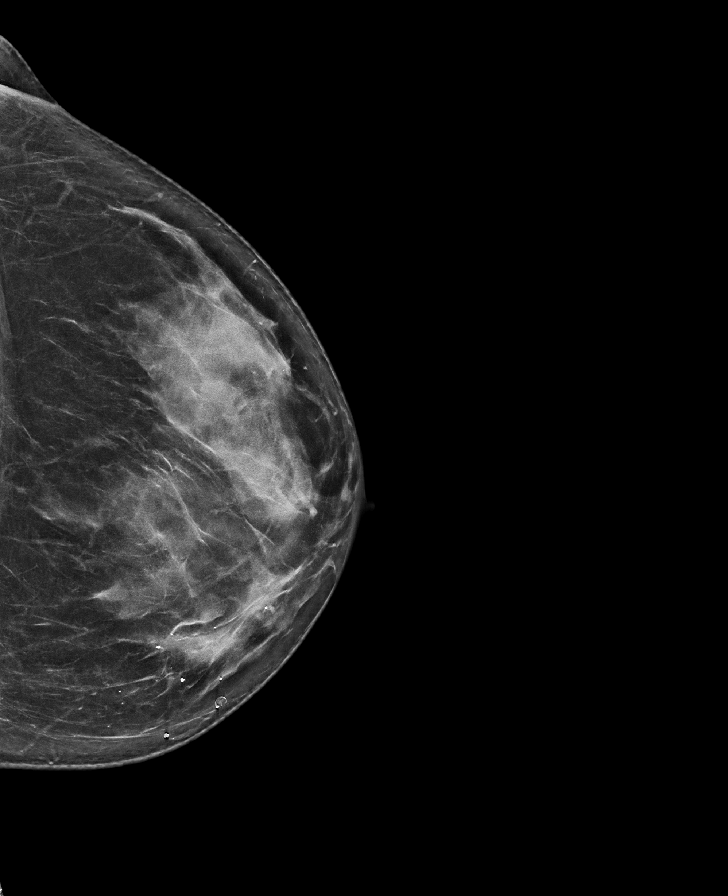

[R MLO synth-2D]
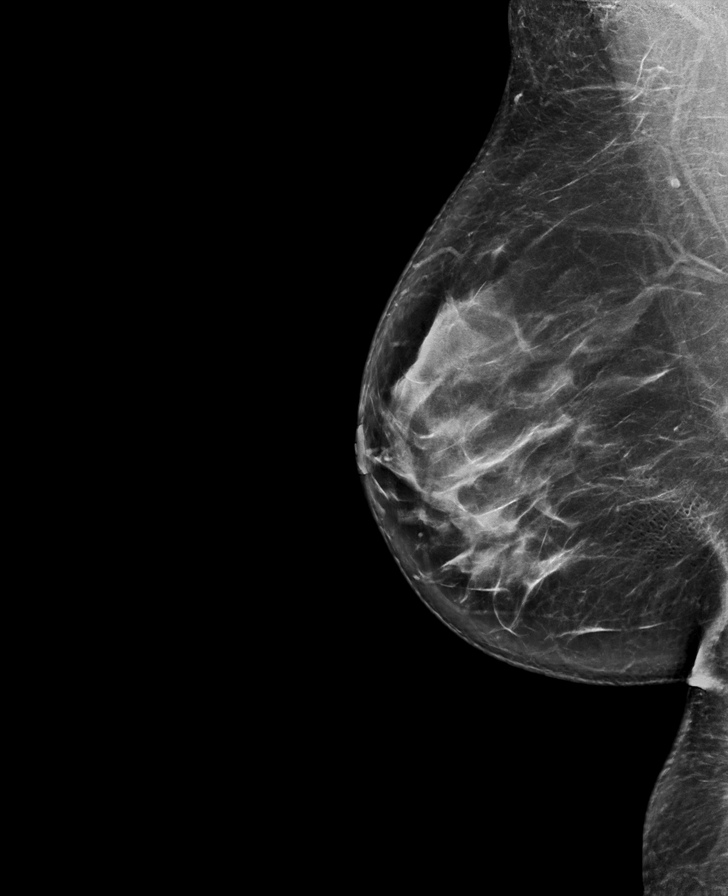

[L MLO synth-2D]
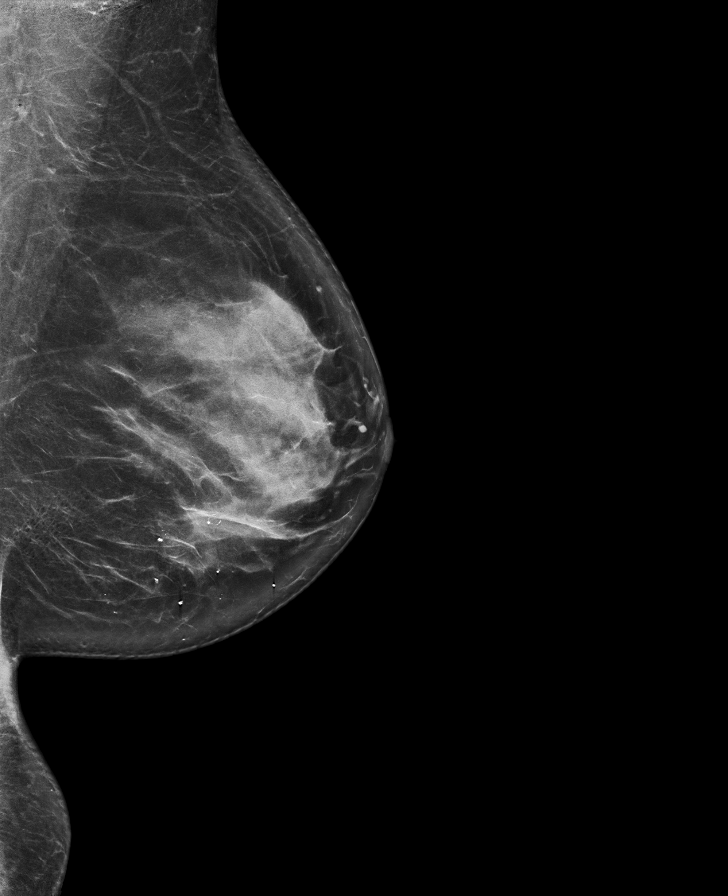

[R MLO tomo · tomo slice 45/90.0]
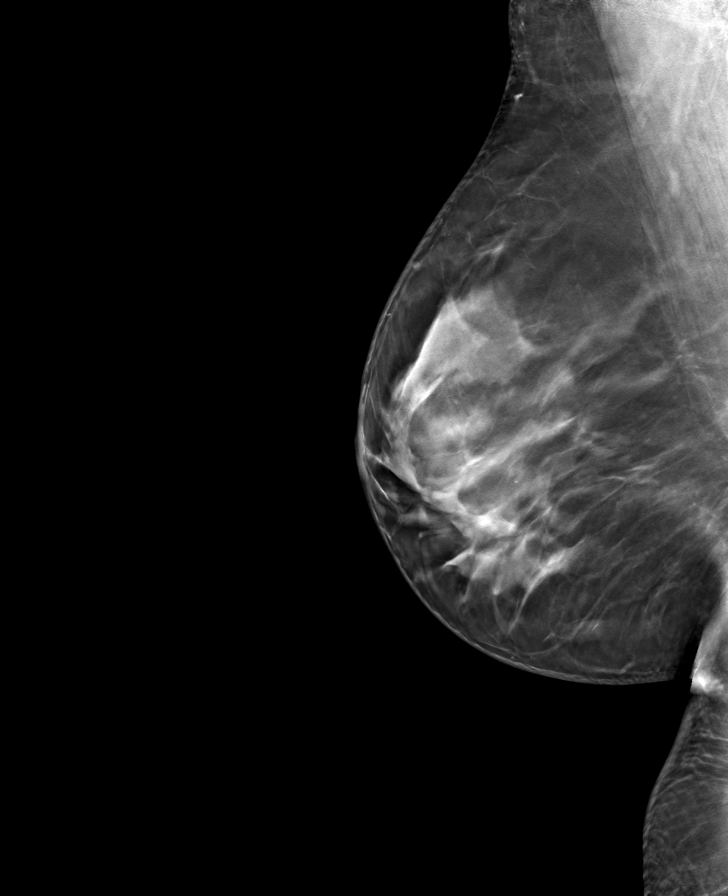

[L CC tomo · tomo slice 41/80.0]
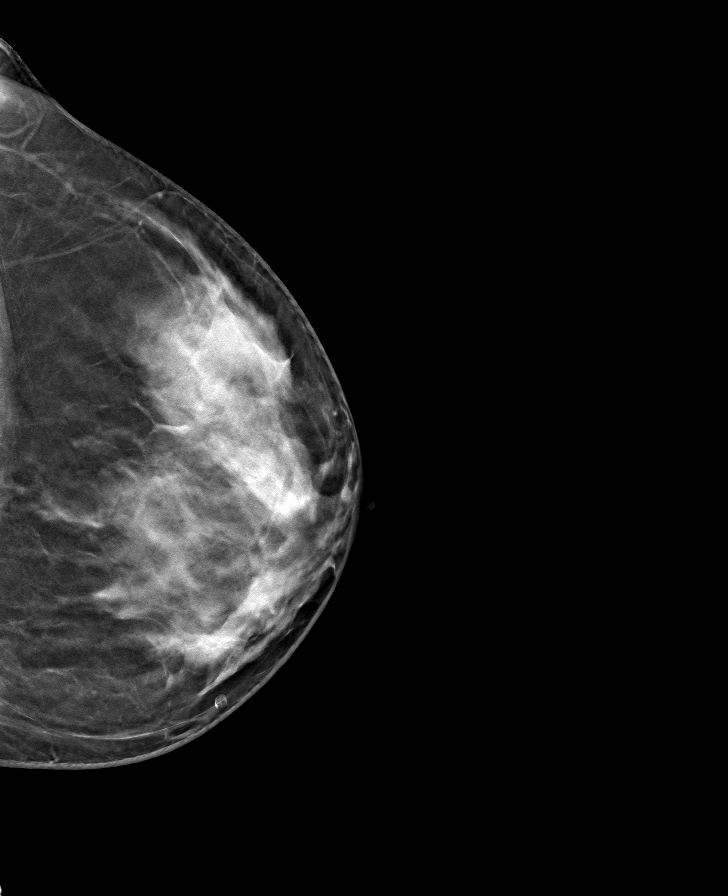

[L MLO tomo · tomo slice 47/92.0]
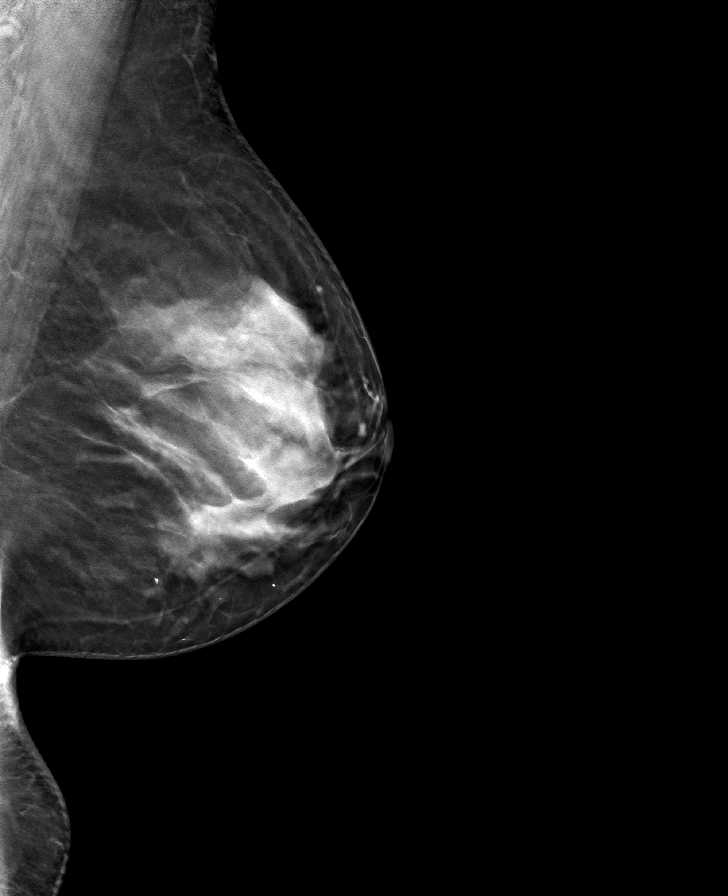

[R CC tomo · tomo slice 39/78.0]
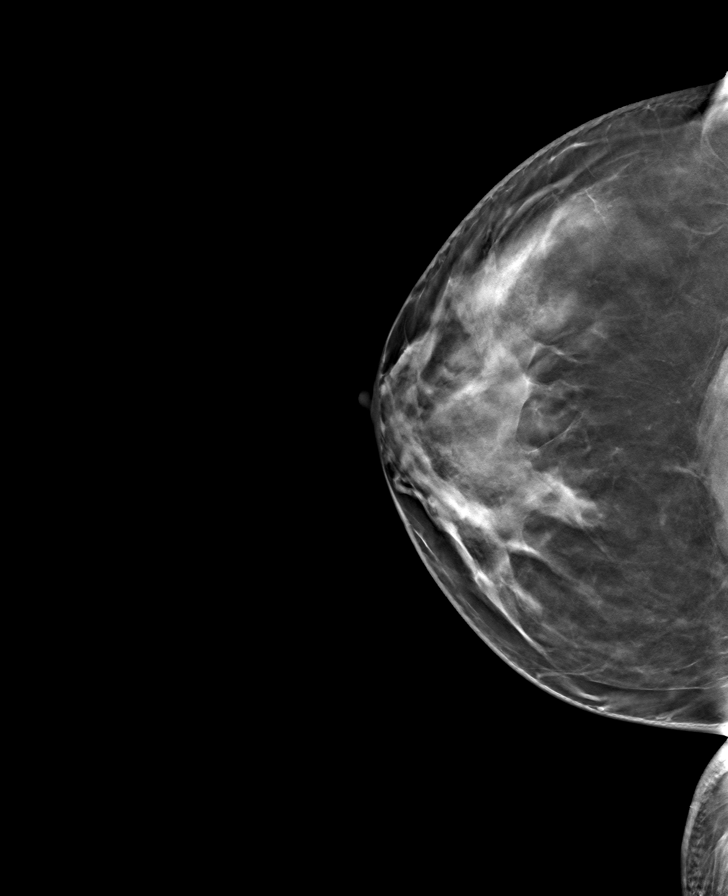

[8 of 24 positions shown; findings below may reference images not displayed]

ACR Breast Density Category c: The breast tissue is heterogeneously
dense, which may obscure small masses.
FINDINGS: There are no findings suspicious for malignancy.
IMPRESSION: No mammographic evidence of malignancy. A result letter of this
screening mammogram will be mailed directly to the patient.

RECOMMENDATION:
Screening mammogram in one year. (Code:Q3-W-BC3)

BI-RADS CATEGORY  1: Negative.

## 2023-01-31 ENCOUNTER — Ambulatory Visit
Admission: RE | Admit: 2023-01-31 | Discharge: 2023-01-31 | Disposition: A | Discharge status: Home / Self Care | Payer: No Typology Code available for payment source | Source: Ambulatory Visit | Attending: Obstetrics and Gynecology | Admitting: Obstetrics and Gynecology

## 2023-01-31 DIAGNOSIS — Z1231 Encounter for screening mammogram for malignant neoplasm of breast: Secondary | ICD-10-CM | POA: Diagnosis present

## 2023-11-15 ENCOUNTER — Other Ambulatory Visit: Payer: Self-pay | Admitting: Obstetrics and Gynecology

## 2023-11-15 DIAGNOSIS — Z1231 Encounter for screening mammogram for malignant neoplasm of breast: Secondary | ICD-10-CM

## 2024-04-06 NOTE — Progress Notes (Signed)
 Chief Complaint  Patient presents with   Neck Pain   Numbness    Arms, legs and feet    IOW interpreter ID [id number]  Patient is agreeable to [employer] scribe.   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  She has been experiencing neck pain for approximately one and a half months, initially localized to her neck and right side, but it has since spread to both shoulders and arms. She reports that an X-ray taken last year showed some arthritis in her neck. Since the onset of her current symptoms, she has used pain patches and ibuprofen , but the pain has persisted.  Two days ago, she began experiencing numbness in her legs and feet, prompting her to seek medical attention. No weakness or dropping objects from her hands, but she has difficulty with tasks requiring hand strength, such as opening a bottle of water , due to numbness and lack of strength.  She experiences urinary incontinence when sneezing or coughing, which she attributes to a recent viral illness. No complete involuntary release of urine while sitting.  She reports back pain that began suddenly while sitting the previous day. Additionally, she describes a sensation that her neck cannot support her head and experiences headaches, which she associates with muscle tension in her neck.    ROS  Review of systems is unremarkable for any active cardiac, respiratory, GI, GU, hematologic, neurologic, dermatologic, HEENT, or psychiatric symptoms except as noted above.  No fevers, chills, or constitutional symptoms.   Current Outpatient Medications  Medication Sig Dispense Refill   fluticasone propionate (FLONASE) 50 mcg/actuation nasal spray Place 2 sprays into both nostrils once daily 16 g 11   hydroCHLOROthiazide (HYDRODIURIL) 25 MG tablet TAKE 1 TABLET(25 MG) BY MOUTH DAILY 90 tablet 1   ergocalciferol , vitamin D2,  1,250 mcg (50,000 unit) capsule Take 1 capsule (50,000 Units total) by mouth once a week for 30 days 13 capsule 3   estradiol (DOTTI) patch 0.1 mg/24 hr Place 1 patch onto the skin twice a week (Patient not taking: Reported on 04/06/2024) 8 patch 11   fenofibrate nanocrystallized (TRICOR) 48 MG tablet TAKE 1 TABLET(48 MG) BY MOUTH DAILY (Patient not taking: Reported on 04/06/2024) 30 tablet 2   gabapentin  (NEURONTIN ) 100 MG capsule Take 1 capsule (100 mg total) by mouth at bedtime for 60 days 30 capsule 1   predniSONE (DELTASONE) 20 MG tablet Take 2 tablets (40 mg total) by mouth once daily for 3 days, THEN 1 tablet (20 mg total) once daily for 3 days. 9 tablet 0   semaglutide (WEGOVY) 0.25 mg/0.5 mL pen injector Inject 0.5 mLs (0.25 mg total) subcutaneously once a week 2 mL 0   tirzepatide (MOUNJARO) 2.5 mg/0.5 mL pen injector Inject 0.5 mLs (2.5 mg total) subcutaneously once a week 2 mL 0   tirzepatide (ZEPBOUND) 2.5 mg/0.5 mL injection Inject 0.5 mLs (2.5 mg total) subcutaneously every 7 (seven) days 2 mL 5   tirzepatide (ZEPBOUND) 2.5 mg/0.5 mL pen injector Inject 0.5 mLs (2.5 mg total) subcutaneously every 7 (seven) days 2 mL 2   No current facility-administered medications for this visit.    Allergies as of 04/06/2024 - Reviewed 04/06/2024  Allergen Reaction Noted   Ciprofloxacin Itching and Rash 11/16/2017   Rocephin [ceftriaxone] Rash 11/16/2017   Ondansetron  Rash 02/12/2020    Patient Active Problem List  Diagnosis   Vitamin  D deficiency   Abnormal uterine bleeding (AUB)   Dyspareunia   Endometriosis   Bilateral leg edema   Essential hypertension, benign   Insomnia   GAD (generalized anxiety disorder)   History of 2019 novel coronavirus disease (COVID-19)   Neurodermatitis   Gastroesophageal reflux disease   Chronic cystitis   Intraoperative bladder injury   Urinary frequency    Past Medical History:  Diagnosis Date   Allergy    Anxiety     COVID-19    GERD (gastroesophageal reflux disease)    Hypertension    Sleep apnea    Vitamin D  deficiency     Past Surgical History:  Procedure Laterality Date   HYSTERECTOMY  01/2016   Dr Verdon, still has cervix and ovaries   CESAREAN SECTION     x3   HERNIA REPAIR Bilateral    at age of 2   TUBAL LIGATION      Vitals:   04/06/24 1436  BP: (!) 128/98  Pulse: 73  SpO2: 97%  Weight: 70.7 kg (155 lb 12.8 oz)  Height: 147.3 cm (4' 10)  PainSc:   8  PainLoc: Neck   Body mass index is 32.56 kg/m.  Exam BP (!) 128/98 (BP Location: Left upper arm, Patient Position: Sitting, BP Cuff Size: Adult)   Pulse 73   Ht 147.3 cm (4' 10)   Wt 70.7 kg (155 lb 12.8 oz)   LMP 01/19/2016 (Exact Date)   SpO2 97%   BMI 32.56 kg/m   General. Well appearing; NAD; VS reviewed     Eyes. Sclera and conjunctiva clear; Vision grossly intact; extraocular movements intact Oropharynx. No suspicious lesions Neck. Supple. No swelling, masses, thyroid normal size, no masses palpated.   Lungs. Respirations unlabored; clear to auscultation bilaterally Cardiovascular. Heart regular rate and rhythm without murmurs, gallops, or rubs MSK: Patient has no cervical or thoracic or lumbar spinal tenderness.  No paravertebral tenderness to the cervical spine.  When axial loading the head it does reproduce pain into both arms with worsening numbness and tingling.  Grip strength is 4 out of 5 strength bilaterally.  All other strength in the upper extremities is intact. Skin. Normal color and turgor Neurologic. Alert and oriented x3; CN 2-12 grossly intact; no focal deficits  Assessment & Plan  Cervical radiculopathy Chronic neck pain with arm radiation, hand numbness, and recent leg and foot numbness. Symptoms persistent and worsening. Differential includes nerve impingement or bulging disc. Previous x-ray showed cervical arthritis. Recent prednisone for sinusitis likely ineffective. - Ordered  MRI of cervical spine to assess for nerve impingement or bulging disc. - Prescribed prednisone taper to reduce inflammation. - Prescribed gabapentin  100 mg at night for nerve pain. - Will refer to physiatry for potential injections if MRI confirms bulging disc.  Acute low back pain Recent low back pain with weakness and spasms, possibly related to cervical radiculopathy.    F/U: Patient to follow-up after MRI results.  JASON HESTLE WHITAKER, PA  This note has been created using automated tools and reviewed for accuracy by JASON HESTLE WHITAKER.   Note: This dictation was prepared with Dragon dictation along with smaller phrase technology. Any transcriptional errors that result from this process are unintentional.

## 2024-04-09 ENCOUNTER — Other Ambulatory Visit: Payer: Self-pay | Admitting: Family Medicine

## 2024-04-09 ENCOUNTER — Ambulatory Visit
Admission: RE | Admit: 2024-04-09 | Discharge: 2024-04-09 | Disposition: A | Discharge status: Home / Self Care | Source: Ambulatory Visit | Attending: Family Medicine | Admitting: Family Medicine

## 2024-04-09 DIAGNOSIS — R202 Paresthesia of skin: Secondary | ICD-10-CM

## 2024-04-09 DIAGNOSIS — M542 Cervicalgia: Secondary | ICD-10-CM | POA: Diagnosis present

## 2024-04-19 ENCOUNTER — Ambulatory Visit
Admission: RE | Admit: 2024-04-19 | Discharge: 2024-04-19 | Disposition: A | Discharge status: Home / Self Care | Source: Ambulatory Visit | Attending: Obstetrics and Gynecology | Admitting: Obstetrics and Gynecology

## 2024-04-19 DIAGNOSIS — Z1231 Encounter for screening mammogram for malignant neoplasm of breast: Secondary | ICD-10-CM | POA: Insufficient documentation
